# Patient Record
Sex: Male | Born: 1950
Health system: Southern US, Community
[De-identification: ages and names within clinical notes are randomized; demographics above are authoritative.]

## PROBLEM LIST (undated history)

## (undated) DIAGNOSIS — H409 Unspecified glaucoma: Secondary | ICD-10-CM

## (undated) DIAGNOSIS — D61818 Other pancytopenia: Secondary | ICD-10-CM

## (undated) DIAGNOSIS — H532 Diplopia: Secondary | ICD-10-CM

## (undated) HISTORY — PX: CATARACT EXTRACTION: SUR2

## (undated) HISTORY — DX: Diplopia: H53.2

## (undated) HISTORY — DX: Other pancytopenia: D61.818

## (undated) HISTORY — DX: Unspecified glaucoma: H40.9

## (undated) HISTORY — PX: HEMORROIDECTOMY: SUR656

---

## 2016-02-26 LAB — LIPID PANEL
Cholesterol: 161 mg/dL (ref 0–200)
HDL: 99 mg/dL — AB (ref 35–70)
LDL Cholesterol: 50 mg/dL
Triglycerides: 61 mg/dL (ref 40–160)

## 2016-02-26 LAB — BASIC METABOLIC PANEL
Creatinine: 1.1 mg/dL (ref <=1.3)
POTASSIUM: 4.4 mmol/L (ref 3.4–5.3)
SODIUM: 140 mmol/L (ref 137–147)

## 2016-02-26 LAB — CBC AND DIFFERENTIAL
HEMATOCRIT: 38 % — AB (ref 41–53)
HEMOGLOBIN: 12.8 g/dL — AB (ref 13.5–17.5)
WBC: 5.6 10*3/mL

## 2016-11-09 ENCOUNTER — Ambulatory Visit (INDEPENDENT_AMBULATORY_CARE_PROVIDER_SITE_OTHER): Payer: Medicare HMO | Admitting: Family

## 2016-11-09 ENCOUNTER — Encounter: Payer: Self-pay | Admitting: Family

## 2016-11-09 VITALS — BP 114/78 | HR 62 | Temp 98.1°F | Resp 14 | Ht 67.5 in | Wt 167.0 lb

## 2016-11-09 DIAGNOSIS — J069 Acute upper respiratory infection, unspecified: Secondary | ICD-10-CM

## 2016-11-09 DIAGNOSIS — H409 Unspecified glaucoma: Secondary | ICD-10-CM | POA: Diagnosis not present

## 2016-11-09 NOTE — Assessment & Plan Note (Signed)
Symptoms and exam consistent with acute upper respiratory infection most likely viral that is resolving. Continue conservative treatment with over-the-counter medications as needed for symptom relief and supportive care. Follow-up if symptoms worsen or do not improve.

## 2016-11-09 NOTE — Progress Notes (Signed)
Subjective:    Patient ID: Tanner Thompson, male    DOB: 20-Feb-1951, 66 y.o.   MRN: [id number]  Chief Complaint  Patient presents with  . Establish Care    cough x2 weeks     HPI:  Tanner Thompson is a 66 y.o. male who  has a past medical history of Glaucoma. and presents today for an office visit to establish care.  1.) Cough -  This is a new problem. Associated symptom of a cough that has been going on for about 2 weeks. No fevers. Modifying factors include cough syrup which seems. Overall course of the symptoms appears to be improving. No recent antibiotics. His wife was diagnosed with the flu a couple of weeks ago.  2.) Glaucoma - Currently maintained on Combigan. Reports taking the medication as prescribed and denies adverse side effects. He is new to the area and is looking to establish care with an ophthalmologist.    No Known Allergies    No outpatient prescriptions prior to visit.   No facility-administered medications prior to visit.      Past Medical History:  Diagnosis Date  . Glaucoma       History reviewed. No pertinent surgical history.    Family History  Problem Relation Age of Onset  . Breast cancer Mother   . Prostate cancer Father       Social History   Social History  . Marital status: Married    Spouse name: N/A  . Number of children: 2  . Years of education: 12   Occupational History  . Retired - Midwife     Social History Main Topics  . Smoking status: Never Smoker  . Smokeless tobacco: Never Used  . Alcohol use Yes     Comment: Occasional  . Drug use: No  . Sexual activity: Not on file   Other Topics Concern  . Not on file   Social History Narrative   Fun: Reading, art, physical activity      Review of Systems  Constitutional: Negative for chills and fever.  HENT: Negative for congestion, ear pain, sinus pain and sinus pressure.   Respiratory: Positive for cough. Negative for chest tightness and shortness of  breath.   Neurological: Negative for headaches.       Objective:    BP 114/78 (BP Location: Left Arm, Patient Position: Sitting, Cuff Size: Normal)   Pulse 62   Temp 98.1 F (36.7 C) (Oral)   Resp 14   Ht 5' 7.5" (1.715 m)   Wt 167 lb (75.8 kg)   SpO2 98%   BMI 25.77 kg/m  Nursing note and vital signs reviewed.  Physical Exam  Constitutional: He is oriented to person, place, and time. He appears well-developed and well-nourished. No distress.  HENT:  Right Ear: Hearing, tympanic membrane, external ear and ear canal normal.  Left Ear: Hearing, tympanic membrane, external ear and ear canal normal.  Nose: Nose normal. Right sinus exhibits no maxillary sinus tenderness and no frontal sinus tenderness. Left sinus exhibits no maxillary sinus tenderness and no frontal sinus tenderness.  Mouth/Throat: Uvula is midline, oropharynx is clear and moist and mucous membranes are normal.  Cardiovascular: Normal rate, regular rhythm, normal heart sounds and intact distal pulses.   Pulmonary/Chest: Effort normal and breath sounds normal.  Neurological: He is alert and oriented to person, place, and time.  Skin: Skin is warm and dry.  Psychiatric: He has a normal mood and affect. His behavior is  normal. Judgment and thought content normal.        Assessment & Plan:   Problem List Items Addressed This Visit      Respiratory   Acute upper respiratory infection - Primary    Symptoms and exam consistent with acute upper respiratory infection most likely viral that is resolving. Continue conservative treatment with over-the-counter medications as needed for symptom relief and supportive care. Follow-up if symptoms worsen or do not improve.        Other   Glaucoma    Glaucoma appears stable with current regimen and no adverse side effects or symptoms of increased pressure. Continue current dosage of Combigan and refer to opthalmology to establish and for follow up.      Relevant Medications    brimonidine-timolol (COMBIGAN) 0.2-0.5 % ophthalmic solution   Other Relevant Orders   Ambulatory referral to Ophthalmology       I am having Mr. Christell ConstantMoore maintain his brimonidine-timolol.   Meds ordered this encounter  Medications  . brimonidine-timolol (COMBIGAN) 0.2-0.5 % ophthalmic solution    Sig: Place 1 drop into both eyes every 12 (twelve) hours.     Follow-up: Return if symptoms worsen or fail to improve.  Jeanine Luzalone, Gregory, FNP

## 2016-11-09 NOTE — Patient Instructions (Addendum)
Thank you for choosing [facility].  SUMMARY AND INSTRUCTIONS:  Medication:  Please continue to take your medication as prescribed.   Referrals:  You will receive a call from our office for an appointment with an ophthalmologist.   Referrals have been made during this visit. You should expect to hear back from our schedulers in about 7-10 days in regards to establishing an appointment with the specialists we discussed.   Follow up:  If your symptoms worsen or fail to improve, please contact our office for further instruction, or in case of emergency go directly to the emergency room at the closest medical facility.   General Recommendations:    Please drink plenty of fluids.  Get plenty of rest   Sleep in humidified air  Use saline nasal sprays  Netti pot   OTC Medications:  Decongestants - helps relieve congestion   Flonase (generic fluticasone) or Nasacort (generic triamcinolone) - please make sure to use the "cross-over" technique at a 45 degree angle towards the opposite eye as opposed to straight up the nasal passageway.   Sudafed (generic pseudoephedrine - Note this is the one that is available behind the pharmacy counter); Products with phenylephrine (-PE) may also be used but is often not as effective as pseudoephedrine.   If you have HIGH BLOOD PRESSURE - Coricidin HBP; AVOID any product that is -D as this contains pseudoephedrine which may increase your blood pressure.  Afrin (oxymetazoline) every 6-8 hours for up to 3 days.   Allergies - helps relieve runny nose, itchy eyes and sneezing   Claritin (generic loratidine), Allegra (fexofenidine), or Zyrtec (generic cyrterizine) for runny nose. These medications should not cause drowsiness.  Note - Benadryl (generic diphenhydramine) may be used however may cause drowsiness  Cough -   Delsym or Robitussin (generic dextromethorphan)  Expectorants - helps loosen mucus to ease removal   Mucinex  (generic guaifenesin) as directed on the package.  Headaches / General Aches   Tylenol (generic acetaminophen) - DO NOT EXCEED 3 grams (3,000 mg) in a 24 hour time period  Advil/Motrin (generic ibuprofen)   Sore Throat -   Salt water gargle   Chloraseptic (generic benzocaine) spray or lozenges / Sucrets (generic dyclonine)      Upper Respiratory Infection, Adult Most upper respiratory infections (URIs) are caused by a virus. A URI affects the nose, throat, and upper air passages. The most common type of URI is often called "the common cold." Follow these instructions at home:  Take medicines only as told by your doctor.  Gargle warm saltwater or take cough drops to comfort your throat as told by your doctor.  Use a warm mist humidifier or inhale steam from a shower to increase air moisture. This may make it easier to breathe.  Drink enough fluid to keep your pee (urine) clear or pale yellow.  Eat soups and other clear broths.  Have a healthy diet.  Rest as needed.  Go back to work when your fever is gone or your doctor says it is okay.  You may need to stay home longer to avoid giving your URI to others.  You can also wear a face mask and wash your hands often to prevent spread of the virus.  Use your inhaler more if you have asthma.  Do not use any tobacco products, including cigarettes, chewing tobacco, or electronic cigarettes. If you need help quitting, ask your doctor. Contact a doctor if:  You are getting worse, not better.  Your  symptoms are not helped by medicine.  You have chills.  You are getting more short of breath.  You have brown or red mucus.  You have yellow or brown discharge from your nose.  You have pain in your face, especially when you bend forward.  You have a fever.  You have puffy (swollen) neck glands.  You have pain while swallowing.  You have white areas in the back of your throat. Get help right away if:  You have very  bad or constant:  Headache.  Ear pain.  Pain in your forehead, behind your eyes, and over your cheekbones (sinus pain).  Chest pain.  You have long-lasting (chronic) lung disease and any of the following:  Wheezing.  Long-lasting cough.  Coughing up blood.  A change in your usual mucus.  You have a stiff neck.  You have changes in your:  Vision.  Hearing.  Thinking.  Mood. This information is not intended to replace advice given to you by your health care provider. Make sure you discuss any questions you have with your health care provider. Document Released: 03/14/2008 Document Revised: 05/29/2016 Document Reviewed: 01/01/2014 Elsevier Interactive Patient Education  2017 [employer]

## 2016-11-09 NOTE — Assessment & Plan Note (Signed)
Glaucoma appears stable with current regimen and no adverse side effects or symptoms of increased pressure. Continue current dosage of Combigan and refer to opthalmology to establish and for follow up.

## 2016-12-19 DIAGNOSIS — H538 Other visual disturbances: Secondary | ICD-10-CM | POA: Diagnosis not present

## 2016-12-19 DIAGNOSIS — H532 Diplopia: Secondary | ICD-10-CM | POA: Diagnosis not present

## 2016-12-23 ENCOUNTER — Encounter (HOSPITAL_COMMUNITY): Payer: Self-pay | Admitting: *Deleted

## 2016-12-23 ENCOUNTER — Emergency Department (HOSPITAL_COMMUNITY): Payer: Medicare HMO

## 2016-12-23 ENCOUNTER — Emergency Department (HOSPITAL_COMMUNITY)
Admission: EM | Admit: 2016-12-23 | Discharge: 2016-12-24 | Disposition: A | Discharge status: Home / Self Care | Payer: Medicare HMO | Attending: Emergency Medicine | Admitting: Emergency Medicine

## 2016-12-23 DIAGNOSIS — D500 Iron deficiency anemia secondary to blood loss (chronic): Secondary | ICD-10-CM | POA: Diagnosis not present

## 2016-12-23 DIAGNOSIS — G939 Disorder of brain, unspecified: Secondary | ICD-10-CM | POA: Insufficient documentation

## 2016-12-23 DIAGNOSIS — H532 Diplopia: Secondary | ICD-10-CM | POA: Diagnosis not present

## 2016-12-23 DIAGNOSIS — D649 Anemia, unspecified: Secondary | ICD-10-CM | POA: Insufficient documentation

## 2016-12-23 DIAGNOSIS — D696 Thrombocytopenia, unspecified: Secondary | ICD-10-CM | POA: Diagnosis not present

## 2016-12-23 DIAGNOSIS — D72819 Decreased white blood cell count, unspecified: Secondary | ICD-10-CM | POA: Diagnosis not present

## 2016-12-23 DIAGNOSIS — H401130 Primary open-angle glaucoma, bilateral, stage unspecified: Secondary | ICD-10-CM | POA: Diagnosis not present

## 2016-12-23 LAB — CBC WITH DIFFERENTIAL/PLATELET
Basophils Absolute: 0 10*3/uL (ref 0.0–0.1)
Basophils Relative: 0 %
Eosinophils Absolute: 0.1 10*3/uL (ref 0.0–0.7)
Eosinophils Relative: 3 %
HCT: 38.7 % — ABNORMAL LOW (ref 39.0–52.0)
HEMOGLOBIN: 12.6 g/dL — AB (ref 13.0–17.0)
LYMPHS ABS: 1.6 10*3/uL (ref 0.7–4.0)
LYMPHS PCT: 46 %
MCH: 26.2 pg (ref 26.0–34.0)
MCHC: 32.6 g/dL (ref 30.0–36.0)
MCV: 80.5 fL (ref 78.0–100.0)
MONOS PCT: 12 %
Monocytes Absolute: 0.5 10*3/uL (ref 0.1–1.0)
NEUTROS ABS: 1.4 10*3/uL — AB (ref 1.7–7.7)
NEUTROS PCT: 39 %
Platelets: 143 10*3/uL — ABNORMAL LOW (ref 150–400)
RBC: 4.81 MIL/uL (ref 4.22–5.81)
RDW: 13.8 % (ref 11.5–15.5)
WBC: 3.6 10*3/uL — ABNORMAL LOW (ref 4.0–10.5)

## 2016-12-23 LAB — COMPREHENSIVE METABOLIC PANEL
ALBUMIN: 4.1 g/dL (ref 3.5–5.0)
ALK PHOS: 59 U/L (ref 38–126)
ALT: 36 U/L (ref 17–63)
ANION GAP: 8 (ref 5–15)
AST: 26 U/L (ref 15–41)
BILIRUBIN TOTAL: 1 mg/dL (ref 0.3–1.2)
BUN: 15 mg/dL (ref 6–20)
CALCIUM: 9.2 mg/dL (ref 8.9–10.3)
CO2: 25 mmol/L (ref 22–32)
Chloride: 106 mmol/L (ref 101–111)
Creatinine, Ser: 1.02 mg/dL (ref 0.61–1.24)
GFR calc non Af Amer: >60 mL/min (ref >60)
GLUCOSE: 89 mg/dL (ref 65–99)
Potassium: 3.9 mmol/L (ref 3.5–5.1)
Sodium: 139 mmol/L (ref 135–145)
TOTAL PROTEIN: 7 g/dL (ref 6.5–8.1)

## 2016-12-23 MED ORDER — GADOBENATE DIMEGLUMINE 529 MG/ML IV SOLN
15.0000 mL | Freq: Once | INTRAVENOUS | Status: AC | PRN
  Administered 2016-12-23: 15 mL via INTRAVENOUS

## 2016-12-23 NOTE — ED Provider Notes (Signed)
[facility]-EMERGENCY DEPT Provider Note   CSN: [id number] Arrival date & time: 12/23/16  1750 By signing my name below, I, Levon Hedger, attest that this documentation has been prepared under the direction and in the presence of Dione Booze, MD . Electronically Signed: Levon Hedger, Scribe. 12/23/2016. 11:36 PM.   History   Chief Complaint Chief Complaint  Patient presents with  . Diplopia   HPI Tanner Thompson is a 66 y.o. male with a history of glaucoma who presents to the Emergency Department complaining of intermittent, gradually improving diplopia onset one week ago. Per pt, his double vision is exacerbated by looking to his left side and alleviated by looking straight ahead. He notes associated dizziness secondary to diplopia that is exacerbated by turning his head. Pt was evaluated by his opthalmologist today and was referred to the ED for a MRI. He denies any difficulty seeing, weakness, numbness, tingling, aphasia, or any other associated symptoms.   The history is provided by the patient. No language interpreter was used.   Past Medical History:  Diagnosis Date  . Glaucoma     Patient Active Problem List   Diagnosis Date Noted  . Glaucoma 11/09/2016  . Acute upper respiratory infection 11/09/2016    History reviewed. No pertinent surgical history.    Home Medications    Prior to Admission medications   Medication Sig Start Date End Date Taking? Authorizing Provider  brimonidine-timolol (COMBIGAN) 0.2-0.5 % ophthalmic solution Place 1 drop into both eyes every 12 (twelve) hours.    Historical Provider, MD    Family History Family History  Problem Relation Age of Onset  . Breast cancer Mother   . Prostate cancer Father     Social History Social History  Substance Use Topics  . Smoking status: Never Smoker  . Smokeless tobacco: Never Used  . Alcohol use Yes     Comment: Occasional     Allergies   Patient has no known allergies.   Review of  Systems Review of Systems  Eyes: Positive for visual disturbance.  Neurological: Positive for dizziness. Negative for speech difficulty, weakness and numbness.  All other systems reviewed and are negative.  Physical Exam Updated Vital Signs BP 122/81 (BP Location: Left Arm)   Pulse (!) 58   Temp 97.9 F (36.6 C) (Oral)   Resp 17   Ht 5' 6.5" (1.689 m)   Wt 165 lb (74.8 kg)   SpO2 100%   BMI 26.23 kg/m   Physical Exam  Constitutional: He is oriented to person, place, and time. He appears well-developed and well-nourished.  HENT:  Head: Normocephalic and atraumatic.  Eyes: Pupils are equal, round, and reactive to light.  Neck: Normal range of motion. Neck supple. No JVD present.  Cardiovascular: Normal rate, regular rhythm and normal heart sounds.   No murmur heard. Pulmonary/Chest: Effort normal and breath sounds normal. He has no wheezes. He has no rales. He exhibits no tenderness.  Abdominal: Soft. Bowel sounds are normal. He exhibits no distension and no mass. There is no tenderness.  Musculoskeletal: Normal range of motion. He exhibits no edema.  Lymphadenopathy:    He has no cervical adenopathy.  Neurological: He is alert and oriented to person, place, and time. No cranial nerve deficit. He exhibits normal muscle tone. Coordination normal.  Decreased lateral movement of both eyes, worse looking to the left. No pronator drift.  Skin: Skin is warm and dry. No rash noted.  Psychiatric: He has a normal mood and affect. His  behavior is normal. Judgment and thought content normal.  Nursing note and vitals reviewed.  ED Treatments / Results  DIAGNOSTIC STUDIES:  Oxygen Saturation is 100% on RA, normal by my interpretation.    COORDINATION OF CARE:  11:34 PM Discussed treatment plan with pt at bedside and pt agreed to plan.   Labs (all labs ordered are listed, but only abnormal results are displayed) Labs Reviewed  CBC WITH DIFFERENTIAL/PLATELET - Abnormal; Notable for  the following:       Result Value   WBC 3.6 (*)    Hemoglobin 12.6 (*)    HCT 38.7 (*)    Platelets 143 (*)    Neutro Abs 1.4 (*)    All other components within normal limits  COMPREHENSIVE METABOLIC PANEL  THYROTROPIN RECEPTOR AUTOABS  ACETYLCHOLINE RECEPTOR AB, ALL    EKG  EKG Interpretation  Date/Time:  Friday December 23 2016 18:35:00 EDT Ventricular Rate:  55 PR Interval:  186 QRS Duration: 86 QT Interval:  408 QTC Calculation: 390 R Axis:   85 Text Interpretation:  Sinus bradycardia Otherwise normal ECG No old tracing to compare Confirmed by KNAPP  MD-J, JON [id number]) on 12/23/2016 11:25:00 PM       Radiology Mr Brain W And Wo Contrast  Result Date: 12/23/2016 CLINICAL DATA:  Double vision and dizziness that worsens head movements EXAM: MRI HEAD AND ORBITS WITHOUT AND WITH CONTRAST TECHNIQUE: Multiplanar, multiecho pulse sequences of the brain and surrounding structures were obtained without and with intravenous contrast. Multiplanar, multiecho pulse sequences of the orbits and surrounding structures were obtained including fat saturation techniques, before and after intravenous contrast administration. CONTRAST:  15mL MULTIHANCE GADOBENATE DIMEGLUMINE 529 MG/ML IV SOLN COMPARISON:  None. FINDINGS: MRI HEAD FINDINGS Brain: No focal diffusion restriction to indicate acute infarct. No intraparenchymal hemorrhage. There is a focal area of hyperintense T2 weighted signal within the brainstem posteriorly, adjacent to the cerebral aqueduct. This is the area of the third cranial nerve nucleus. No associated contrast enhancement. No midline shift or other mass effect. No hydrocephalus or extra-axial fluid collection. No age advanced or lobar predominant atrophy. Vascular: Major intracranial arterial and venous sinus flow voids are preserved. No evidence of chronic microhemorrhage or amyloid angiopathy. Skull and upper cervical spine: The visualized skull base, calvarium, upper cervical spine  and extracranial soft tissues are normal. MRI ORBITS FINDINGS Orbits: Both optic nerves are normal in caliber and signal. The optic chiasm is normal. The extraocular muscles and lacrimal fossae are normal. Visualized sinuses: No fluid levels or advanced mucosal thickening. Soft tissues: Normal IMPRESSION: 1. Hyperintense T2 weighted signal lesion of the posterior brainstem, at the site of the oculomotor nerve nucleus, with no contrast enhancement or diffusion restriction. The etiology of this lesion is uncertain, but the primary differential considerations are neoplasm, such as a low grade glioma, or demyelinating disease. Infectious or vascular processes are considered less likely. 2. Normal appearance of the orbits and optic nerves. Electronically Signed   By: Deatra Robinson M.D.   On: 12/23/2016 23:47   Mr Rockwell [place] IR Contrast  Result Date: 12/23/2016 CLINICAL DATA:  Double vision and dizziness that worsens head movements EXAM: MRI HEAD AND ORBITS WITHOUT AND WITH CONTRAST TECHNIQUE: Multiplanar, multiecho pulse sequences of the brain and surrounding structures were obtained without and with intravenous contrast. Multiplanar, multiecho pulse sequences of the orbits and surrounding structures were obtained including fat saturation techniques, before and after intravenous contrast administration. CONTRAST:  15mL MULTIHANCE GADOBENATE DIMEGLUMINE 529 MG/ML IV  SOLN COMPARISON:  None. FINDINGS: MRI HEAD FINDINGS Brain: No focal diffusion restriction to indicate acute infarct. No intraparenchymal hemorrhage. There is a focal area of hyperintense T2 weighted signal within the brainstem posteriorly, adjacent to the cerebral aqueduct. This is the area of the third cranial nerve nucleus. No associated contrast enhancement. No midline shift or other mass effect. No hydrocephalus or extra-axial fluid collection. No age advanced or lobar predominant atrophy. Vascular: Major intracranial arterial and venous sinus flow voids  are preserved. No evidence of chronic microhemorrhage or amyloid angiopathy. Skull and upper cervical spine: The visualized skull base, calvarium, upper cervical spine and extracranial soft tissues are normal. MRI ORBITS FINDINGS Orbits: Both optic nerves are normal in caliber and signal. The optic chiasm is normal. The extraocular muscles and lacrimal fossae are normal. Visualized sinuses: No fluid levels or advanced mucosal thickening. Soft tissues: Normal IMPRESSION: 1. Hyperintense T2 weighted signal lesion of the posterior brainstem, at the site of the oculomotor nerve nucleus, with no contrast enhancement or diffusion restriction. The etiology of this lesion is uncertain, but the primary differential considerations are neoplasm, such as a low grade glioma, or demyelinating disease. Infectious or vascular processes are considered less likely. 2. Normal appearance of the orbits and optic nerves. Electronically Signed   By: Deatra RobinsonKevin  Herman M.D.   On: 12/23/2016 23:47    Procedures Procedures (including critical care time)  Medications Ordered in ED Medications  gadobenate dimeglumine (MULTIHANCE) injection 15 mL (15 mLs Intravenous Contrast Given 12/23/16 2300)     Initial Impression / Assessment and Plan / ED Course  I have reviewed the triage vital signs and the nursing notes.  Pertinent labs & imaging results that were available during my care of the patient were reviewed by me and considered in my medical decision making (see chart for details).  Diplopia of uncertain cause. No other neurologic findings. MRI has been ordered and does show a lesion in the brainstem of uncertain cause. I discussed the case with Dr. Amada JupiterKirkpatrick of neurology service who has reviewed the MRI scan. Given stability of symptoms over one week, he does not feel that the patient needs to be admitted. He does need outpatient referral to neurology and this is arranged. He is to continue wearing an eye patch to prevent  diplopia but advised to alternate which eye is patched to maintain normal vision in each eye. Mild anemia is noted of uncertain significance. Also mild leukopenia and thrombocytopenia. This may indicate a viral illness, but is probably not related to his diplopia. Old records are reviewed, and he has no relevant past visits.  Final Clinical Impressions(s) / ED Diagnoses   Final diagnoses:  Diplopia  Brainstem lesion  Normochromic normocytic anemia    New Prescriptions New Prescriptions   No medications on file   I personally performed the services described in this documentation, which was scribed in my presence. The recorded information has been reviewed and is accurate.       Dione Boozeavid Teralyn Mullins, MD 12/24/16 [phone]

## 2016-12-23 NOTE — ED Triage Notes (Signed)
Pt seen by ophthalmology today sent here for MRI per notes provided by pt, pt has L eye patch, pt reports dizzinesse when he moves his head & double vision, pt A&O x4, no slurred speech, A&O x4

## 2016-12-23 NOTE — ED Notes (Signed)
Pt reports he is having double vision and has been having same for a week now. Pt reports decreased double vision since this all initially started.

## 2016-12-23 NOTE — ED Notes (Signed)
Pt approached nurse first regarding wait time for MRI; MRI states that he is the next patient back, est 10 mins

## 2016-12-23 NOTE — ED Notes (Signed)
Mesner, MD aware of pts presentation & request from ophthalmology office, verbal order to place orders

## 2016-12-24 LAB — THYROTROPIN RECEPTOR AUTOABS: Thyrotropin Receptor Ab: 0.9 IU/L (ref 0.00–1.75)

## 2016-12-24 NOTE — Discharge Instructions (Signed)
Your MRI scan did show something in the back part of the brain. Please follow up with the neurologist for further evaluation. You may need to have the MRI repeated in 2-4 weeks to see if this lesion is changing. In the meantime, please continue to wear a patch over one eye to prevent double vision. Please vary which eye is patched so that you continue to use each eye for vision.

## 2016-12-28 ENCOUNTER — Encounter: Payer: Self-pay | Admitting: Neurology

## 2016-12-28 ENCOUNTER — Ambulatory Visit (INDEPENDENT_AMBULATORY_CARE_PROVIDER_SITE_OTHER): Payer: Medicare HMO | Admitting: Neurology

## 2016-12-28 VITALS — BP 103/63 | HR 65 | Ht 66.5 in | Wt 168.0 lb

## 2016-12-28 DIAGNOSIS — R9089 Other abnormal findings on diagnostic imaging of central nervous system: Secondary | ICD-10-CM | POA: Diagnosis not present

## 2016-12-28 DIAGNOSIS — H532 Diplopia: Secondary | ICD-10-CM | POA: Diagnosis not present

## 2016-12-28 DIAGNOSIS — D61818 Other pancytopenia: Secondary | ICD-10-CM | POA: Diagnosis not present

## 2016-12-28 HISTORY — DX: Other pancytopenia: D61.818

## 2016-12-28 NOTE — Patient Instructions (Signed)
   We will check blood work today, and possibly get a spinal tap in the future.

## 2016-12-28 NOTE — Progress Notes (Signed)
Reason for visit: Double vision  Referring physician: [place]  Tanner Thompson is a 66 y.o. male  History of present illness:  Tanner Thompson is a 66 year old right-handed black male with a history of onset of double vision around the ninth or 10th of March 2018. The patient went to his eye doctor and was from there sent to the emergency room. The patient was noted to have incomplete temporal deviation of the right eye resulting in double vision when he looked to the right. The patient denied any headache or eye pain with onset, but he noted the problem came on relatively suddenly. Since that time, his double vision has improved some. He does report some dizziness sensation and mild gait instability with the double vision. He went to the emergency room and underwent MRI of the brain showing evidence of a dorsal pontine/midbrain lesion that was not fully consistent with stroke. The patient has been sent to this office for an evaluation. Blood work in the emergency room shows evidence of a mild pancytopenia. The patient has known about a mild anemia for at least a year or more. The patient was treated with vitamin D supplementation and iron. The patient denies any other symptoms such as slurred speech, headache, neck stiffness, swallowing problems, or numbness or weakness of the face, arms, or legs. He denies any memory problems or confusion. The patient is coming here for further evaluation of the double vision. The patient has noted a decline in taste perception, this has happened since the double vision, he denies any night sweats or weight loss.  Past Medical History:  Diagnosis Date  . Glaucoma     Past Surgical History:  Procedure Laterality Date  . CATARACT EXTRACTION Bilateral    One eye laser removal, other removed about 15 yr ago  . HEMORROIDECTOMY      Family History  Problem Relation Age of Onset  . Breast cancer Mother   . Prostate cancer Father     Social history:  reports  that he has never smoked. He has never used smokeless tobacco. He reports that he drinks alcohol. He reports that he does not use drugs.  Medications:  Prior to Admission medications   Medication Sig Start Date End Date Taking? Authorizing Provider  brimonidine-timolol (COMBIGAN) 0.2-0.5 % ophthalmic solution Place 1 drop into both eyes every 12 (twelve) hours.   Yes Historical Provider, MD     No Known Allergies  ROS:  Out of a complete 14 system review of symptoms, the patient complains only of the following symptoms, and all other reviewed systems are negative.  Fatigue Dizziness Blurred vision, double vision Allergies Numbness, dizziness Too much sleep, decreased energy, change in appetite  Blood pressure 103/63, pulse 65, height 5' 6.5" (1.689 m), weight 168 lb (76.2 kg).  Physical Exam  General: The patient is alert and cooperative at the time of the examination.  Eyes: Pupils are equal, round, and reactive to light. Discs are flat bilaterally.  Neck: The neck is supple, no carotid bruits are noted.  Respiratory: The respiratory examination is clear.  Cardiovascular: The cardiovascular examination reveals a regular rate and rhythm, no obvious murmurs or rubs are noted.  Skin: Extremities are without significant edema.  Neurologic Exam  Mental status: The patient is alert and oriented x 3 at the time of the examination. The patient has apparent normal recent and remote memory, with an apparently normal attention span and concentration ability.  Cranial nerves: Facial symmetry  is present. There is good sensation of the face to pinprick and soft touch bilaterally. The strength of the facial muscles and the muscles to head turning and shoulder shrug are normal bilaterally. Speech is well enunciated, no aphasia or dysarthria is noted. Extraocular movements are full, with exception that there is incomplete abduction of the right eye with looking to the right. Visual fields  are full. The tongue is midline, and the patient has symmetric elevation of the soft palate. No obvious hearing deficits are noted.  Motor: The motor testing reveals 5 over 5 strength of all 4 extremities. Good symmetric motor tone is noted throughout.  Sensory: Sensory testing is intact to pinprick, soft touch, vibration sensation, and position sense on all 4 extremities. No evidence of extinction is noted.  Coordination: Cerebellar testing reveals good finger-nose-finger and heel-to-shin bilaterally.  Gait and station: Gait is normal. Tandem gait is slightly unsteady. Romberg is negative. No drift is seen.  Reflexes: Deep tendon reflexes are symmetric and normal bilaterally. Toes are downgoing bilaterally.    MRI brain 12/23/16:  IMPRESSION: 1. Hyperintense T2 weighted signal lesion of the posterior brainstem, at the site of the oculomotor nerve nucleus, with no contrast enhancement or diffusion restriction. The etiology of this lesion is uncertain, but the primary differential considerations are neoplasm, such as a low grade glioma, or demyelinating disease. Infectious or vascular processes are considered less likely. 2. Normal appearance of the orbits and optic nerves.  * MRI scan images were reviewed online. I agree with the written report.    Assessment/Plan:  1. Double vision  2. Abnormal MRI brain  3. Pancytopenia  The patient has a midline brainstem lesion, etiology is unclear. The patient will need to be evaluated for possible demyelinating disease, Devic's disease, neurosarcoidosis, or vasculitis. The patient will undergo blood work today, we may consider a lumbar puncture in the near future. The patient will follow-up in about 4 months. We will repeat the MRI of the brain at some point in the future. The patient is to contact our office if any new symptoms arise. The patient does have a pancytopenia, he may require a hematologic evaluation in the future.  Tanner Palau. Keith  Kahli Mayon MD 12/28/2016 3:48 PM  [facility] [address] [place], [place] [id number]  Phone [phone] Fax [phone]

## 2016-12-30 LAB — ACETYLCHOLINE RECEPTOR AB, ALL: ACETYLCHOL BLOCK AB: 4 % (ref 0–25)

## 2016-12-31 LAB — ANGIOTENSIN CONVERTING ENZYME: ANGIO CONVERT ENZYME: 55 U/L (ref 14–82)

## 2016-12-31 LAB — MULTIPLE MYELOMA PANEL, SERUM
Albumin SerPl Elph-Mcnc: 4.2 g/dL (ref 2.9–4.4)
Albumin/Glob SerPl: 1.5 (ref 0.7–1.7)
Alpha 1: 0.2 g/dL (ref 0.0–0.4)
Alpha2 Glob SerPl Elph-Mcnc: 0.5 g/dL (ref 0.4–1.0)
B-GLOBULIN SERPL ELPH-MCNC: 0.9 g/dL (ref 0.7–1.3)
GAMMA GLOB SERPL ELPH-MCNC: 1.3 g/dL (ref 0.4–1.8)
GLOBULIN, TOTAL: 2.9 g/dL (ref 2.2–3.9)
IGA/IMMUNOGLOBULIN A, SERUM: 155 mg/dL (ref 61–437)
IgG (Immunoglobin G), Serum: 1227 mg/dL (ref 700–1600)
IgM (Immunoglobulin M), Srm: 42 mg/dL (ref 20–172)
Total Protein: 7.1 g/dL (ref 6.0–8.5)

## 2016-12-31 LAB — PAN-ANCA
ANCA Proteinase 3: <3.5 U/mL (ref 0.0–3.5)
Atypical pANCA: <1:20 {titer}
C-ANCA: <1:20 {titer}
Myeloperoxidase Ab: <9 U/mL (ref 0.0–9.0)

## 2016-12-31 LAB — HIV ANTIBODY (ROUTINE TESTING W REFLEX): HIV SCREEN 4TH GENERATION: NONREACTIVE

## 2016-12-31 LAB — VITAMIN B12: Vitamin B-12: 832 pg/mL (ref 232–1245)

## 2016-12-31 LAB — NEUROMYELITIS OPTICA AUTOAB, IGG: NMO IgG Autoantibodies: <1.5 U/mL (ref 0.0–3.0)

## 2016-12-31 LAB — SEDIMENTATION RATE: Sed Rate: 4 mm/hr (ref 0–30)

## 2016-12-31 LAB — RHEUMATOID FACTOR: Rhuematoid fact SerPl-aCnc: <10 IU/mL (ref 0.0–13.9)

## 2016-12-31 LAB — B. BURGDORFI ANTIBODIES: Lyme IgG/IgM Ab: <0.91 {ISR} (ref 0.00–0.90)

## 2016-12-31 LAB — ANA W/REFLEX: ANA: NEGATIVE

## 2017-01-03 ENCOUNTER — Telehealth: Payer: Self-pay | Admitting: Neurology

## 2017-01-03 NOTE — Telephone Encounter (Signed)
I called patient. The blood work was completely normal. Etiology of the brain lesion is not clear, we'll need to recheck a MRI the brain sometime in the next several months. I have recommended getting lumbar puncture, the patient wishes to think about this procedure, he will call me later if he decides that he wants the procedure done.

## 2017-01-04 ENCOUNTER — Encounter: Payer: Self-pay | Admitting: Family

## 2017-01-27 DIAGNOSIS — H401130 Primary open-angle glaucoma, bilateral, stage unspecified: Secondary | ICD-10-CM | POA: Diagnosis not present

## 2017-01-27 DIAGNOSIS — H532 Diplopia: Secondary | ICD-10-CM | POA: Diagnosis not present

## 2017-02-20 ENCOUNTER — Encounter: Payer: Self-pay | Admitting: Family

## 2017-02-20 ENCOUNTER — Ambulatory Visit (INDEPENDENT_AMBULATORY_CARE_PROVIDER_SITE_OTHER): Payer: Medicare HMO | Admitting: Family

## 2017-02-20 ENCOUNTER — Other Ambulatory Visit (INDEPENDENT_AMBULATORY_CARE_PROVIDER_SITE_OTHER): Payer: Medicare HMO

## 2017-02-20 VITALS — BP 108/66 | HR 59 | Temp 98.3°F | Resp 16 | Ht 66.5 in | Wt 166.0 lb

## 2017-02-20 DIAGNOSIS — Z1211 Encounter for screening for malignant neoplasm of colon: Secondary | ICD-10-CM

## 2017-02-20 DIAGNOSIS — Z23 Encounter for immunization: Secondary | ICD-10-CM

## 2017-02-20 DIAGNOSIS — Z125 Encounter for screening for malignant neoplasm of prostate: Secondary | ICD-10-CM

## 2017-02-20 DIAGNOSIS — Z0000 Encounter for general adult medical examination without abnormal findings: Secondary | ICD-10-CM

## 2017-02-20 LAB — LIPID PANEL
CHOL/HDL RATIO: 2
Cholesterol: 180 mg/dL (ref 0–200)
HDL: 87 mg/dL (ref >39.00)
LDL Cholesterol: 80 mg/dL (ref 0–99)
NONHDL: 93.03
Triglycerides: 63 mg/dL (ref 0.0–149.0)
VLDL: 12.6 mg/dL (ref 0.0–40.0)

## 2017-02-20 LAB — CBC
HCT: 40.6 % (ref 39.0–52.0)
HEMOGLOBIN: 13.4 g/dL (ref 13.0–17.0)
MCHC: 32.9 g/dL (ref 30.0–36.0)
MCV: 80.1 fl (ref 78.0–100.0)
Platelets: 160 10*3/uL (ref 150.0–400.0)
RBC: 5.06 Mil/uL (ref 4.22–5.81)
RDW: 14.3 % (ref 11.5–15.5)
WBC: 3.2 10*3/uL — ABNORMAL LOW (ref 4.0–10.5)

## 2017-02-20 LAB — COMPREHENSIVE METABOLIC PANEL
ALT: 16 U/L (ref 0–53)
AST: 17 U/L (ref 0–37)
Albumin: 4.5 g/dL (ref 3.5–5.2)
Alkaline Phosphatase: 98 U/L (ref 39–117)
BILIRUBIN TOTAL: 0.8 mg/dL (ref 0.2–1.2)
BUN: 15 mg/dL (ref 6–23)
CO2: 29 meq/L (ref 19–32)
Calcium: 9.2 mg/dL (ref 8.4–10.5)
Chloride: 107 mEq/L (ref 96–112)
Creatinine, Ser: 1.13 mg/dL (ref 0.40–1.50)
GFR: 83.16 mL/min (ref >60.00)
GLUCOSE: 106 mg/dL — AB (ref 70–99)
Potassium: 4.6 mEq/L (ref 3.5–5.1)
Sodium: 140 mEq/L (ref 135–145)
Total Protein: 7.1 g/dL (ref 6.0–8.3)

## 2017-02-20 LAB — PSA: PSA: 0.44 ng/mL (ref 0.10–4.00)

## 2017-02-20 MED ORDER — ZOSTER VAC RECOMB ADJUVANTED 50 MCG/0.5ML IM SUSR: 0.5000 mL | Freq: Once | INTRAMUSCULAR | 0 refills | Status: DC

## 2017-02-20 MED ORDER — ZOSTER VAC RECOMB ADJUVANTED 50 MCG/0.5ML IM SUSR: 0.5000 mL | Freq: Once | INTRAMUSCULAR | 0 refills | Status: AC

## 2017-02-20 NOTE — Progress Notes (Signed)
Subjective:    Patient ID: Tanner Thompson, male    DOB: 12-02-1950, 66 y.o.   MRN: [id number]  Chief Complaint  Patient presents with  . AWV    fasting    HPI:  Tanner Thompson is a 66 y.o. male who presents today for a Medicare Annual Wellness/Physical exam.    1) Health Maintenance -   Diet - Averages about 2-3 meals per day consisting of a regular diet; Caffeine intake of 1-2 cups per day.  Exercise - Walks daily for about 30 minutes; considering joining the gym.   2) Preventative Exams / Immunizations:  Dental -- Up todate  Vision -- Up to date   Health Maintenance  Topic Date Due  . TETANUS/TDAP  10/20/1968  . COLONOSCOPY  10/21/1999  . PNA vac Low Risk Adult (1 of 2 - PCV13) 10/20/2014  . INFLUENZA VACCINE  05/10/2017  . Hepatitis C Screening  Completed     Immunization History  Administered Date(s) Administered  . Pneumococcal Conjugate-13 02/20/2017    RISK FACTORS  Tobacco History  Smoking Status  . Never Smoker  Smokeless Tobacco  . Never Used     Cardiac risk factors: advanced age (older than 44 for men, 11 for women) and male gender.  Depression Screen  Depression screen [facility] 2/9 02/20/2017  Decreased Interest 0  Down, Depressed, Hopeless 0  PHQ - 2 Score 0     Activities of Daily Living In your present state of health, do you have any difficulty performing the following activities?:  Driving? No Managing money?  No Feeding yourself? No Getting from bed to chair? No Climbing a flight of stairs? No Preparing food and eating?: No Bathing or showering? No Getting dressed: No Getting to the toilet? No Using the toilet: No Moving around from place to place: No In the past year have you fallen or had a near fall?:No   Home Safety Has smoke detector and wears seat belts. No firearms. No excess sun exposure. Are there smokers in your home (other than you)?  No Do you feel safe at home?  Yes  Hearing Difficulties: No Do you  often ask people to speak up or repeat themselves? No Do you experience ringing or noises in your ears? No  Do you have difficulty understanding soft or whispered voices? No    Cognitive Testing  Alert? Yes   Normal Appearance? Yes  Oriented to person? Yes  Place? Yes   Time? Yes  Recall of three objects?  Yes  Can perform simple calculations? Yes  Displays appropriate judgment? Yes  Can read the correct time from a watch face? Yes  Do you feel that you have a problem with memory? No  Do you often misplace items? No   Advanced Directives have been discussed with the patient? Yes   Current Physicians/Providers and Suppliers  1. Marcos Eke, FNP - Internal Medicine 2. Stephanie Acre, MD - Neurology  Indicate any recent Medical Services you may have received from other than [facility] providers in the past year (date may be approximate).  All answers were reviewed with the patient and necessary referrals were made:  Jeanine Luz, FNP   02/20/2017    No Known Allergies   Outpatient Medications Prior to Visit  Medication Sig Dispense Refill  . brimonidine-timolol (COMBIGAN) 0.2-0.5 % ophthalmic solution Place 1 drop into both eyes every 12 (twelve) hours.     No facility-administered medications prior to visit.  Past Medical History:  Diagnosis Date  . Diplopia   . Glaucoma   . Pancytopenia (HCC) 12/28/2016     Past Surgical History:  Procedure Laterality Date  . CATARACT EXTRACTION Bilateral    One eye laser removal, other removed about 15 yr ago  . HEMORROIDECTOMY       Family History  Problem Relation Age of Onset  . Breast cancer Mother   . Prostate cancer Father      Social History   Social History  . Marital status: Married    Spouse name: Steward Drone  . Number of children: 2  . Years of education: 12   Occupational History  . Retired - Midwife     Social History Main Topics  . Smoking status: Never Smoker  . Smokeless tobacco: Never Used  .  Alcohol use Yes     Comment: Occasional  . Drug use: No  . Sexual activity: Not on file   Other Topics Concern  . Not on file   Social History Narrative   Lives with spouse   Fun: Reading, art, physical activity     Review of Systems  Constitutional: Denies fever, chills, fatigue, or significant weight gain/loss. HENT: Head: Denies headache or neck pain Ears: Denies changes in hearing, ringing in ears, earache, drainage Nose: Denies discharge, stuffiness, itching, nosebleed, sinus pain Throat: Denies sore throat, hoarseness, dry mouth, sores, thrush Eyes: Denies loss/changes in vision, pain, redness, blurry/double vision, flashing lights Cardiovascular: Denies chest pain/discomfort, tightness, palpitations, shortness of breath with activity, difficulty lying down, swelling, sudden awakening with shortness of breath Respiratory: Denies shortness of breath, cough, sputum production, wheezing Gastrointestinal: Denies dysphasia, heartburn, change in appetite, nausea, change in bowel habits, rectal bleeding, constipation, diarrhea, yellow skin or eyes Genitourinary: Denies frequency, urgency, burning/pain, blood in urine, incontinence, change in urinary strength. Musculoskeletal: Denies muscle/joint pain, stiffness, back pain, redness or swelling of joints, trauma Skin: Denies rashes, lumps, itching, dryness, color changes, or hair/nail changes Neurological: Denies dizziness, fainting, seizures, weakness, numbness, tingling, tremor Psychiatric - Denies nervousness, stress, depression or memory loss Endocrine: Denies heat or cold intolerance, sweating, frequent urination, excessive thirst, changes in appetite Hematologic: Denies ease of bruising or bleeding    Objective:     BP 108/66 (BP Location: Left Arm, Patient Position: Sitting, Cuff Size: Normal)   Pulse (!) 59   Temp 98.3 F (36.8 C) (Oral)   Resp 16   Ht 5' 6.5" (1.689 m)   Wt 166 lb (75.3 kg)   SpO2 97%   BMI 26.39  kg/m  Nursing note and vital signs reviewed.   Visual Acuity Screening   Right eye Left eye Both eyes  Without correction:     With correction: 20/13 20/13 20/13     Physical Exam  Constitutional: He is oriented to person, place, and time. He appears well-developed and well-nourished.  HENT:  Head: Normocephalic.  Right Ear: Hearing, tympanic membrane, external ear and ear canal normal.  Left Ear: Hearing, tympanic membrane, external ear and ear canal normal.  Nose: Nose normal.  Mouth/Throat: Uvula is midline, oropharynx is clear and moist and mucous membranes are normal.  Eyes: Conjunctivae and EOM are normal. Pupils are equal, round, and reactive to light.  Neck: Neck supple. No JVD present. No tracheal deviation present. No thyromegaly present.  Cardiovascular: Normal rate, regular rhythm, normal heart sounds and intact distal pulses.   Pulmonary/Chest: Effort normal and breath sounds normal.  Abdominal: Soft. Bowel sounds are normal. He exhibits  no distension and no mass. There is no tenderness. There is no rebound and no guarding.  Musculoskeletal: Normal range of motion. He exhibits no edema or tenderness.  Lymphadenopathy:    He has no cervical adenopathy.  Neurological: He is alert and oriented to person, place, and time. He has normal reflexes. No cranial nerve deficit. He exhibits normal muscle tone. Coordination normal.  Skin: Skin is warm and dry.  Psychiatric: He has a normal mood and affect. His behavior is normal. Judgment and thought content normal.       Assessment & Plan:   During the course of the visit the patient was educated and counseled about appropriate screening and preventive services including:    Pneumococcal vaccine   Td vaccine  Prostate cancer screening  Colorectal cancer screening  Glaucoma screening  Nutrition counseling   Diet review for nutrition referral? Yes ____  Not Indicated _X___   Patient Instructions (the written plan)  was given to the patient.  Medicare Attestation I have personally reviewed: The patient's medical and social history Their use of alcohol, tobacco or illicit drugs Their current medications and supplements The patient's functional ability including ADLs,fall risks, home safety risks, cognitive, and hearing and visual impairment Diet and physical activities Evidence for depression or mood disorders  The patient's weight, height, BMI,  have been recorded in the chart.  I have made referrals, counseling, and provided education to the patient based on review of the above and I have provided the patient with a written personalized care plan for preventive services.     Problem List Items Addressed This Visit      Other   Medicare annual wellness visit, subsequent - Primary    Reviewed and updated patient's medical, surgical, family and social history. Medications and allergies were also reviewed. Basic screenings for depression, activities of daily living, hearing, cognition and safety were performed. Provider list was updated and health plan was provided to the patient.       Routine adult health maintenance    1) Anticipatory Guidance: Discussed importance of wearing a seatbelt while driving and not texting while driving; changing batteries in smoke detector at least once annually; wearing suntan lotion when outside; eating a balanced and moderate diet; getting physical activity at least 30 minutes per day.  2) Immunizations / Screenings / Labs:  Prevnar updated today. Pneumovax in 6-12 months. Written prescription for Shingrix series provided with administration instructions. Declines tetanus. All other immunizations are up-to-date per recommendations. Obtain PSA for prostate cancer screening. Due for colon cancer screening with referral to gastroenterology placed. All other screenings are up-to-date per recommendations. Obtain CBC, CMET, and lipid profile.   Overall well exam with risk  factors for cardiovascular disease being minimal at this time. He does have glaucoma. He eats a regular diet that is balanced, moderate, and varied. He exercises regularly. He is of good weight. Overall good health. Continue healthy lifestyle behaviors and choices. Follow-up prevention exam in 1 year. Follow-up office visit pending blood work as needed.        Relevant Orders   CBC   Comprehensive metabolic panel   Lipid panel   PSA    Other Visit Diagnoses    Colon cancer screening       Relevant Orders   Ambulatory referral to Gastroenterology   Need for vaccination with 13-polyvalent pneumococcal conjugate vaccine       Relevant Orders   Pneumococcal conjugate vaccine 13-valent (Completed)  I am having Mr. Wenker maintain his brimonidine-timolol and Zoster Vac Recomb Adjuvanted.   Meds ordered this encounter  Medications  . DISCONTD: Zoster Vac Recomb Adjuvanted [facility]) injection    Sig: Inject 0.5 mLs into the muscle once.    Dispense:  0.5 mL    Refill:  0    Order Specific Question:   Supervising Provider    Answer:   Hillard Danker A [[id number]]  . Zoster Vac Recomb Adjuvanted [facility]) injection    Sig: Inject 0.5 mLs into the muscle once.    Dispense:  0.5 mL    Refill:  0    To be completed 2-6 months after the first injection.    Order Specific Question:   Supervising Provider    Answer:   Hillard Danker A [[id number]]     Follow-up: Return in about 1 year (around 02/20/2018), or if symptoms worsen or fail to improve.   Jeanine Luz, FNP

## 2017-02-20 NOTE — Assessment & Plan Note (Signed)
1) Anticipatory Guidance: Discussed importance of wearing a seatbelt while driving and not texting while driving; changing batteries in smoke detector at least once annually; wearing suntan lotion when outside; eating a balanced and moderate diet; getting physical activity at least 30 minutes per day.  2) Immunizations / Screenings / Labs:  Prevnar updated today. Pneumovax in 6-12 months. Written prescription for Shingrix series provided with administration instructions. Declines tetanus. All other immunizations are up-to-date per recommendations. Obtain PSA for prostate cancer screening. Due for colon cancer screening with referral to gastroenterology placed. All other screenings are up-to-date per recommendations. Obtain CBC, CMET, and lipid profile.   Overall well exam with risk factors for cardiovascular disease being minimal at this time. He does have glaucoma. He eats a regular diet that is balanced, moderate, and varied. He exercises regularly. He is of good weight. Overall good health. Continue healthy lifestyle behaviors and choices. Follow-up prevention exam in 1 year. Follow-up office visit pending blood work as needed.

## 2017-02-20 NOTE — Assessment & Plan Note (Signed)
Reviewed and updated patient's medical, surgical, family and social history. Medications and allergies were also reviewed. Basic screenings for depression, activities of daily living, hearing, cognition and safety were performed. Provider list was updated and health plan was provided to the patient.  

## 2017-02-20 NOTE — Patient Instructions (Signed)
Thank you for choosing [facility].  SUMMARY AND INSTRUCTIONS:  Safe to start the Shingrix (Shingles) series in about 1 month. A second injection will need to be completed 2-6 months after the first injection.  Continue to take your medications as prescribed.   They will call to schedule your appointment for the colonoscopy.  Your health appears very good at present - keep up the great work!   Labs:  Please stop by the lab on the lower level of the building for your blood work. Your results will be released to MyChart (or called to you) after review, usually within 72 hours after test completion. If any changes need to be made, you will be notified at that same time.  1.) The lab is open from 7:30am to 5:30 pm Monday-Friday 2.) No appointment is necessary 3.) Fasting (if needed) is 6-8 hours after food and drink; black coffee and water are okay   Follow up:  If your symptoms worsen or fail to improve, please contact our office for further instruction, or in case of emergency go directly to the emergency room at the closest medical facility.    Health Maintenance  Topic Date Due  . TETANUS/TDAP  10/20/1968  . COLONOSCOPY  10/21/1999  . PNA vac Low Risk Adult (1 of 2 - PCV13) 10/20/2014  . INFLUENZA VACCINE  05/10/2017  . Hepatitis C Screening  Completed     Health Maintenance, Male A healthy lifestyle and preventive care is important for your health and wellness. Ask your health care provider about what schedule of regular examinations is right for you. What should I know about weight and diet?  Eat a Healthy Diet  Eat plenty of vegetables, fruits, whole grains, low-fat dairy products, and lean protein.  Do not eat a lot of foods high in solid fats, added sugars, or salt. Maintain a Healthy Weight  Regular exercise can help you achieve or maintain a healthy weight. You should:  Do at least 150 minutes of exercise each week. The exercise should increase your heart  rate and make you sweat (moderate-intensity exercise).  Do strength-training exercises at least twice a week. Watch Your Levels of Cholesterol and Blood Lipids  Have your blood tested for lipids and cholesterol every 5 years starting at 66 years of age. If you are at high risk for heart disease, you should start having your blood tested when you are 67 years old. You may need to have your cholesterol levels checked more often if:  Your lipid or cholesterol levels are high.  You are older than 67 years of age.  You are at high risk for heart disease. What should I know about cancer screening? Many types of cancers can be detected early and may often be prevented. Lung Cancer  You should be screened every year for lung cancer if:  You are a current smoker who has smoked for at least 30 years.  You are a former smoker who has quit within the past 15 years.  Talk to your health care provider about your screening options, when you should start screening, and how often you should be screened. Colorectal Cancer  Routine colorectal cancer screening usually begins at 67 years of age and should be repeated every 5-10 years until you are 67 years old. You may need to be screened more often if early forms of precancerous polyps or small growths are found. Your health care provider may recommend screening at an earlier age if you have risk  factors for colon cancer.  Your health care provider may recommend using home test kits to check for hidden blood in the stool.  A small camera at the end of a tube can be used to examine your colon (sigmoidoscopy or colonoscopy). This checks for the earliest forms of colorectal cancer. Prostate and Testicular Cancer  Depending on your age and overall health, your health care provider may do certain tests to screen for prostate and testicular cancer.  Talk to your health care provider about any symptoms or concerns you have about testicular or prostate  cancer. Skin Cancer  Check your skin from head to toe regularly.  Tell your health care provider about any new moles or changes in moles, especially if:  There is a change in a mole's size, shape, or color.  You have a mole that is larger than a pencil eraser.  Always use sunscreen. Apply sunscreen liberally and repeat throughout the day.  Protect yourself by wearing long sleeves, pants, a wide-brimmed hat, and sunglasses when outside. What should I know about heart disease, diabetes, and high blood pressure?  If you are 23-10 years of age, have your blood pressure checked every 3-5 years. If you are 34 years of age or older, have your blood pressure checked every year. You should have your blood pressure measured twice-once when you are at a hospital or clinic, and once when you are not at a hospital or clinic. Record the average of the two measurements. To check your blood pressure when you are not at a hospital or clinic, you can use:  An automated blood pressure machine at a pharmacy.  A home blood pressure monitor.  Talk to your health care provider about your target blood pressure.  If you are between 78-35 years old, ask your health care provider if you should take aspirin to prevent heart disease.  Have regular diabetes screenings by checking your fasting blood sugar level.  If you are at a normal weight and have a low risk for diabetes, have this test once every three years after the age of 56.  If you are overweight and have a high risk for diabetes, consider being tested at a younger age or more often.  A one-time screening for abdominal aortic aneurysm (AAA) by ultrasound is recommended for men aged 65-75 years who are current or former smokers. What should I know about preventing infection? Hepatitis B  If you have a higher risk for hepatitis B, you should be screened for this virus. Talk with your health care provider to find out if you are at risk for hepatitis B  infection. Hepatitis C  Blood testing is recommended for:  Everyone born from [birth year] through 1966.  Anyone with known risk factors for hepatitis C. Sexually Transmitted Diseases (STDs)  You should be screened each year for STDs including gonorrhea and chlamydia if:  You are sexually active and are younger than 67 years of age.  You are older than 67 years of age and your health care provider tells you that you are at risk for this type of infection.  Your sexual activity has changed since you were last screened and you are at an increased risk for chlamydia or gonorrhea. Ask your health care provider if you are at risk.  Talk with your health care provider about whether you are at high risk of being infected with HIV. Your health care provider may recommend a prescription medicine to help prevent HIV infection. What else can  I do?  Schedule regular health, dental, and eye exams.  Stay current with your vaccines (immunizations).  Do not use any tobacco products, such as cigarettes, chewing tobacco, and e-cigarettes. If you need help quitting, ask your health care provider.  Limit alcohol intake to no more than 2 drinks per day. One drink equals 12 ounces of beer, 5 ounces of wine, or 1 ounces of hard liquor.  Do not use street drugs.  Do not share needles.  Ask your health care provider for help if you need support or information about quitting drugs.  Tell your health care provider if you often feel depressed.  Tell your health care provider if you have ever been abused or do not feel safe at home. This information is not intended to replace advice given to you by your health care provider. Make sure you discuss any questions you have with your health care provider. Document Released: 03/24/2008 Document Revised: 05/25/2016 Document Reviewed: 06/30/2015 Elsevier Interactive Patient Education  2017 [employer]

## 2017-02-24 ENCOUNTER — Telehealth: Payer: Self-pay | Admitting: Family

## 2017-02-24 NOTE — Telephone Encounter (Signed)
Pt called stating Tanner Thompson wanted to know when his last colonoscopy was,  last Colonoscopy was June 5th, 2014

## 2017-02-28 NOTE — Telephone Encounter (Signed)
Noted  

## 2017-02-28 NOTE — Telephone Encounter (Signed)
Put into pts chart

## 2017-05-04 ENCOUNTER — Ambulatory Visit (INDEPENDENT_AMBULATORY_CARE_PROVIDER_SITE_OTHER): Payer: Medicare HMO | Admitting: Neurology

## 2017-05-04 ENCOUNTER — Encounter: Payer: Self-pay | Admitting: Neurology

## 2017-05-04 VITALS — BP 104/63 | HR 53 | Ht 67.0 in | Wt 172.0 lb

## 2017-05-04 DIAGNOSIS — D61818 Other pancytopenia: Secondary | ICD-10-CM

## 2017-05-04 DIAGNOSIS — H532 Diplopia: Secondary | ICD-10-CM

## 2017-05-04 NOTE — Patient Instructions (Signed)
   We will check MRI of the brain and the neck, consider a spinal tap

## 2017-05-04 NOTE — Progress Notes (Signed)
Reason for visit: Double vision  Tanner DykesBenjamin C Thompson is an 66 y.o. male  History of present illness:  [name] is a 66 year old right-handed black male with a history of double vision that began in March 2018. The patient has had a brainstem lesion in the periaqueductal gray area, no other lesions were seen. The patient had extensive blood work evaluation that showed a mild pancytopenia, he has gone on multivitamins with iron and this seems to have improved the hemoglobin and platelet level, the patient still has a low white blood count. The patient did not wish to do a lumbar puncture to evaluate him for possible demyelinating disease. The patient indicates that within a week after he was seen on 12/28/2016, the patient had good improvement of the double vision, he has not had any recurrence of double vision since that time. He reports no other new symptoms of numbness, weakness, balance changes, cognitive changes, or difficulty controlling the bowels or the bladder. He returns for an evaluation. He does drink alcohol, but he claims that he drinks only occasionally, and not heavily.  Past Medical History:  Diagnosis Date  . Diplopia   . Glaucoma   . Pancytopenia (HCC) 12/28/2016    Past Surgical History:  Procedure Laterality Date  . CATARACT EXTRACTION Bilateral    One eye laser removal, other removed about 15 yr ago  . HEMORROIDECTOMY      Family History  Problem Relation Age of Onset  . Breast cancer Mother   . Prostate cancer Father     Social history:  reports that he has never smoked. He has never used smokeless tobacco. He reports that he drinks alcohol. He reports that he does not use drugs.   No Known Allergies  Medications:  Prior to Admission medications   Medication Sig Start Date End Date Taking? Authorizing Provider  brimonidine-timolol (COMBIGAN) 0.2-0.5 % ophthalmic solution Place 1 drop into both eyes every 12 (twelve) hours.   Yes [provider]     ROS:  Out of a complete 14 system review of symptoms, the patient complains only of the following symptoms, and all other reviewed systems are negative.  Double vision, resolved  Blood pressure 104/63, pulse (!) 53, height 5\' 7"  (1.702 m), weight 172 lb (78 kg).  Physical Exam  General: The patient is alert and cooperative at the time of the examination.  Skin: No significant peripheral edema is noted.   Neurologic Exam  Mental status: The patient is alert and oriented x 3 at the time of the examination. The patient has apparent normal recent and remote memory, with an apparently normal attention span and concentration ability.   Cranial nerves: Facial symmetry is present. Speech is normal, no aphasia or dysarthria is noted. Extraocular movements are full. Visual fields are full. Cover test is negative.  Motor: The patient has good strength in all 4 extremities.  Sensory examination: Soft touch sensation is symmetric on the face, arms, and legs.  Coordination: The patient has good finger-nose-finger and heel-to-shin bilaterally.  Gait and station: The patient has a normal gait. Tandem gait is normal. Romberg is negative. No drift is seen.  Reflexes: Deep tendon reflexes are symmetric.   Assessment/Plan:  1. Double vision, resolved  2. Abnormal MRI brain  3. History of pancytopenia  The lesion seen by MRI did not appear to be consistent with a small vessel ischemic stroke, the possibility of demyelinating disease does need to be considered even though the patient  is 66 years old. The patient be sent for further blood work to include a CBC and a thiamine level. The patient will be set up for a repeat MRI of the brain and cervical spine looking for any new lesions that would suggest demyelinating disease. The studies will be done with and without gadolinium enhancement. The patient clinically has returned to normal, he will follow-up in 4 months. If he desires to have a  lumbar puncture, we will get this set up.  Marlan Palau. Keith Badr Piedra MD 05/04/2017 7:24 AM  [facility] [address] [place], [place] [id number]  Phone [phone] Fax [phone]

## 2017-05-07 LAB — CBC WITH DIFFERENTIAL/PLATELET
BASOS: 1 %
Basophils Absolute: 0 10*3/uL (ref 0.0–0.2)
EOS (ABSOLUTE): 0.2 10*3/uL (ref 0.0–0.4)
EOS: 6 %
HEMOGLOBIN: 12.4 g/dL — AB (ref 13.0–17.7)
Hematocrit: 37.8 % (ref 37.5–51.0)
IMMATURE GRANULOCYTES: 0 %
Immature Grans (Abs): 0 10*3/uL (ref 0.0–0.1)
Lymphocytes Absolute: 1.6 10*3/uL (ref 0.7–3.1)
Lymphs: 45 %
MCH: 25.8 pg — ABNORMAL LOW (ref 26.6–33.0)
MCHC: 32.8 g/dL (ref 31.5–35.7)
MCV: 79 fL (ref 79–97)
MONOS ABS: 0.4 10*3/uL (ref 0.1–0.9)
Monocytes: 10 %
Neutrophils Absolute: 1.4 10*3/uL (ref 1.4–7.0)
Neutrophils: 38 %
Platelets: 154 10*3/uL (ref 150–379)
RBC: 4.8 x10E6/uL (ref 4.14–5.80)
RDW: 14.4 % (ref 12.3–15.4)
WBC: 3.6 10*3/uL (ref 3.4–10.8)

## 2017-05-07 LAB — VITAMIN B1: Thiamine: 87.4 nmol/L (ref 66.5–200.0)

## 2017-05-18 IMAGING — MR MR HEAD WO/W CM
10 of 16 series · 27 of 48 positions shown · IV contrast (Yes   MULTIHANCE)
Comparison: None.

CLINICAL DATA: Double vision and dizziness that worsens head
movements

EXAM:
MRI HEAD AND ORBITS WITHOUT AND WITH CONTRAST
TECHNIQUE: Multiplanar, multiecho pulse sequences of the brain and surrounding
structures were obtained without and with intravenous contrast.
Multiplanar, multiecho pulse sequences of the orbits and surrounding
structures were obtained including fat saturation techniques, before
and after intravenous contrast administration.
CONTRAST:  15mL MULTIHANCE GADOBENATE DIMEGLUMINE 529 MG/ML IV SOLN

[Series 5: DWI · axial · 3.0mm · 1.09mm/px · z∈[-61,+79]mm · 9 of 96 slices shown (1 of 2)]
[im 1/96]
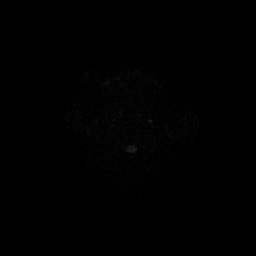
[im 12/96]
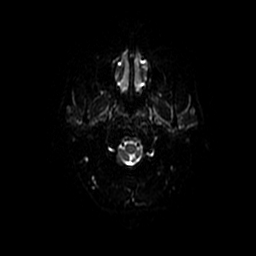
[im 24/96]
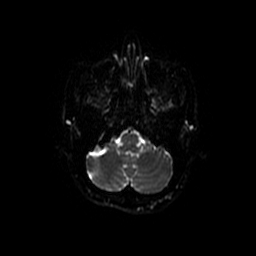
[im 36/96]
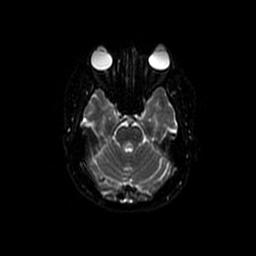
[im 48/96]
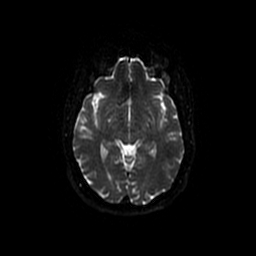
[im 60/96]
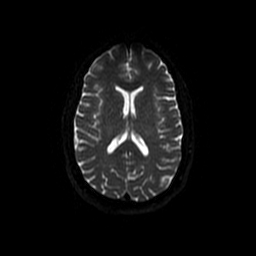
[im 72/96]
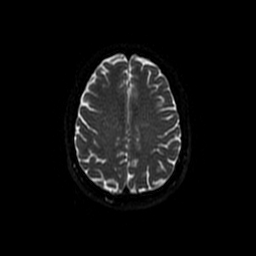
[im 84/96]
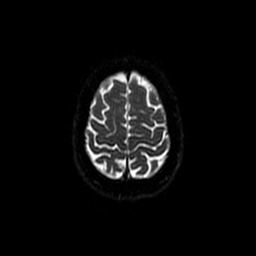
[im 96/96]
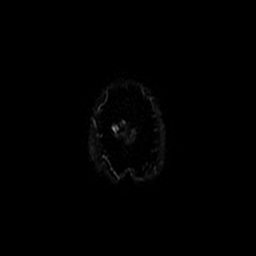

[Series 6: T2 · axial · 5.0mm · 0.43mm/px · z∈[-66,+77]mm · 3 of 25 slices shown]
[im 1/25]
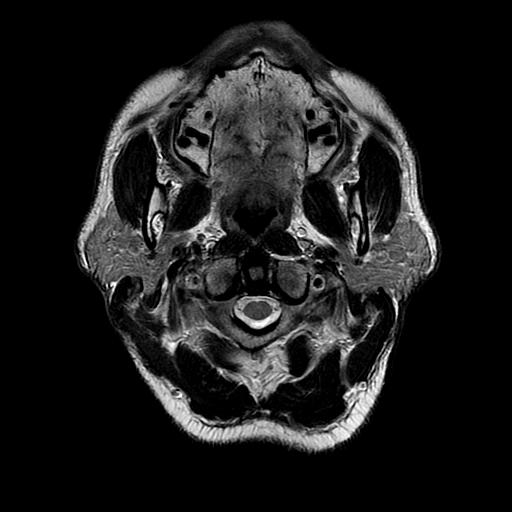
[im 13/25]
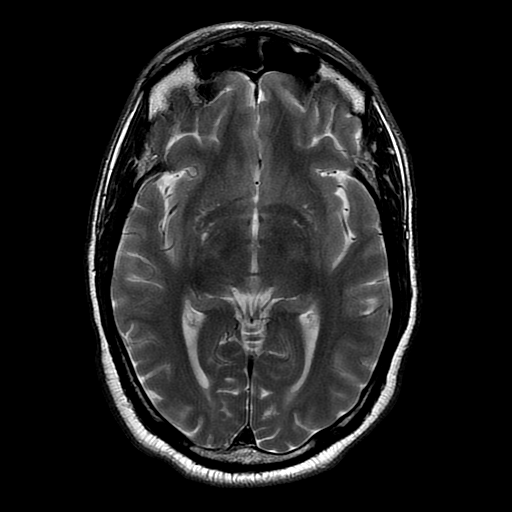
[im 25/25]
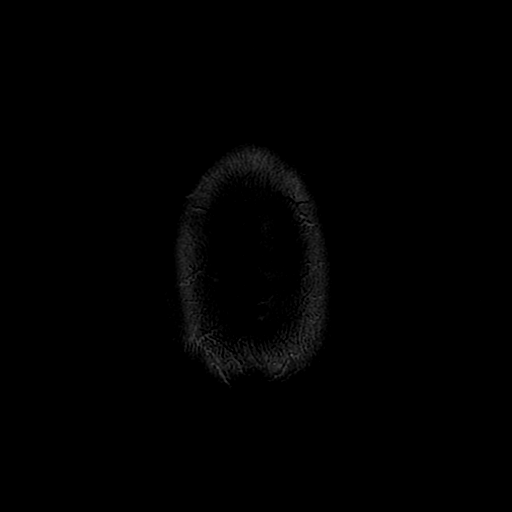

[Series 7: FLAIR · axial · 5.0mm · 0.43mm/px · z∈[-66,+77]mm · 2 of 25 slices shown]
[im 1/25]
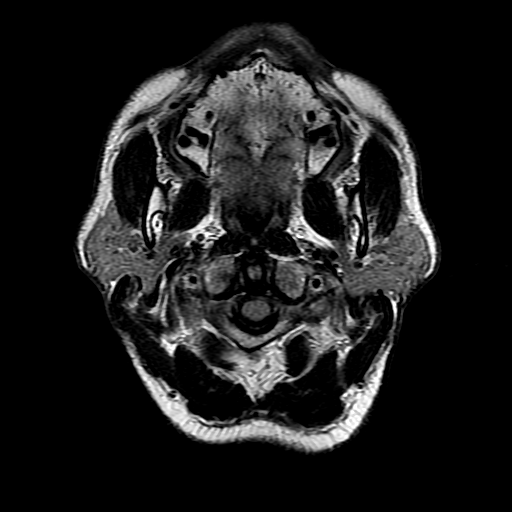
[im 25/25]
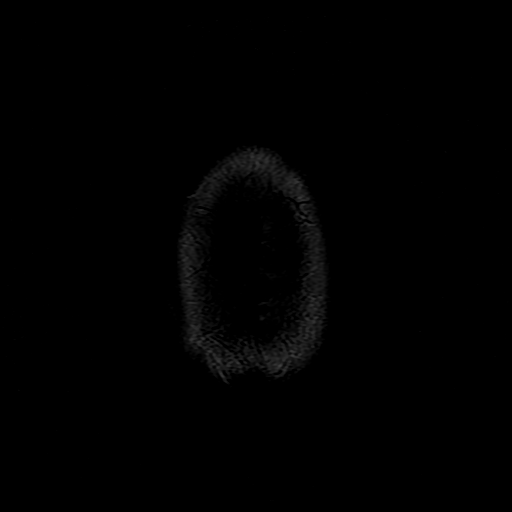

[Series 12: T2 fat-sat · axial · 3.0mm · 0.35mm/px · 1 of 15 slices shown (1 of 2)]
[im 1/15]
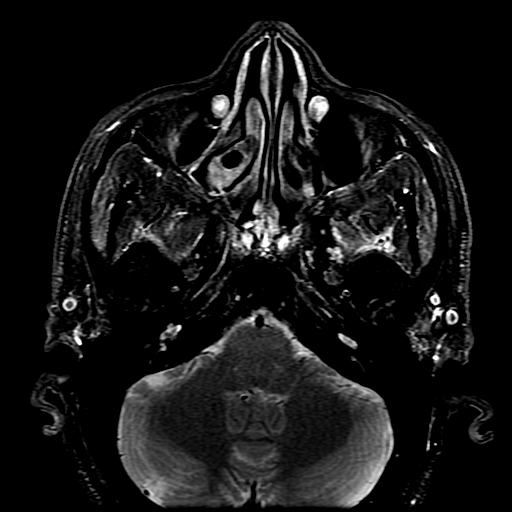

[Series 13: T2 fat-sat · coronal · 3.0mm · 0.35mm/px · 2 of 30 slices shown (2 of 2)]
[im 1/30]
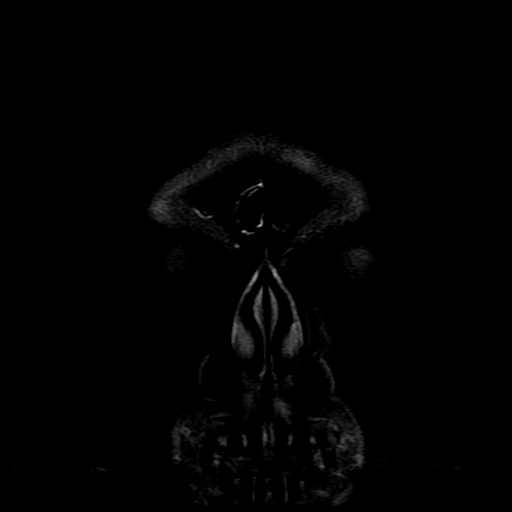
[im 30/30]
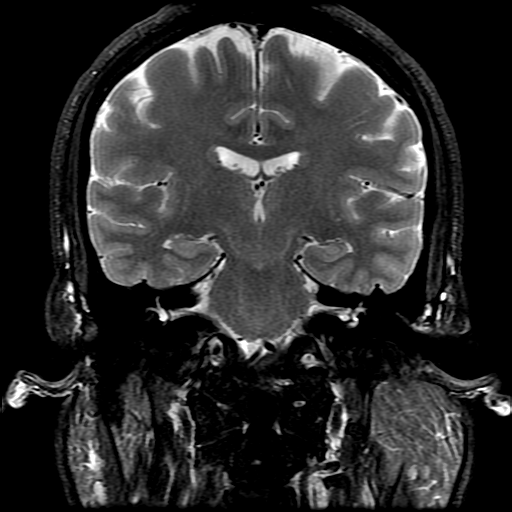

[Series 14: T2 post-contrast · coronal · 5.0mm · 0.39mm/px · 1 of 26 slices shown]
[im 1/26]
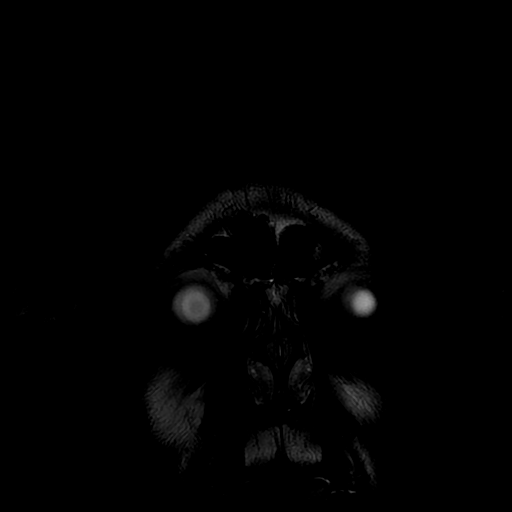

[Series 16: T1 post-contrast · coronal · 5.0mm · 0.43mm/px · 2 of 26 slices shown (1 of 3)]
[im 1/26]
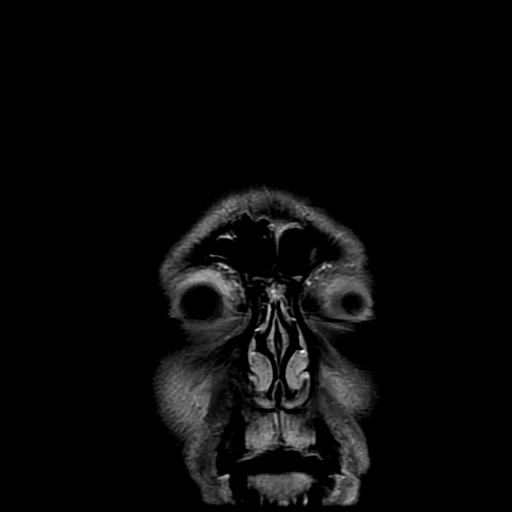
[im 26/26]
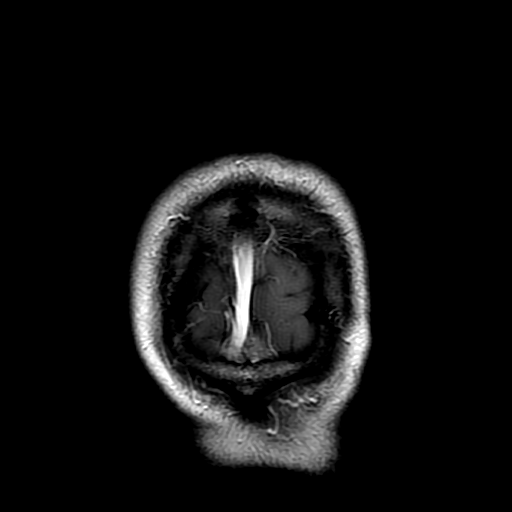

[Series 17: T1 post-contrast · axial · 3.0mm · 0.35mm/px · 1 of 15 slices shown (2 of 3)]
[im 1/15]
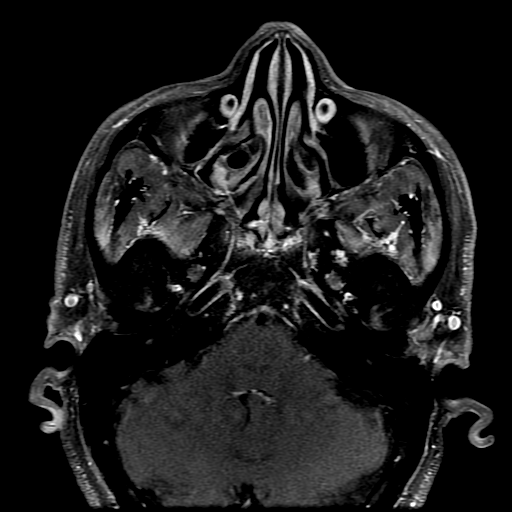

[Series 18: T1 post-contrast · coronal · 3.0mm · 0.35mm/px · 2 of 30 slices shown (3 of 3)]
[im 1/30]
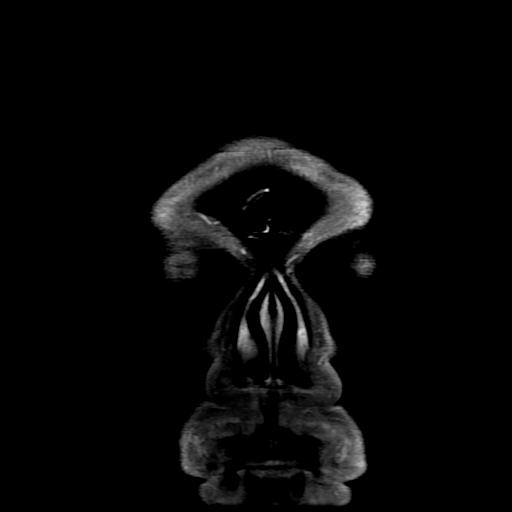
[im 30/30]
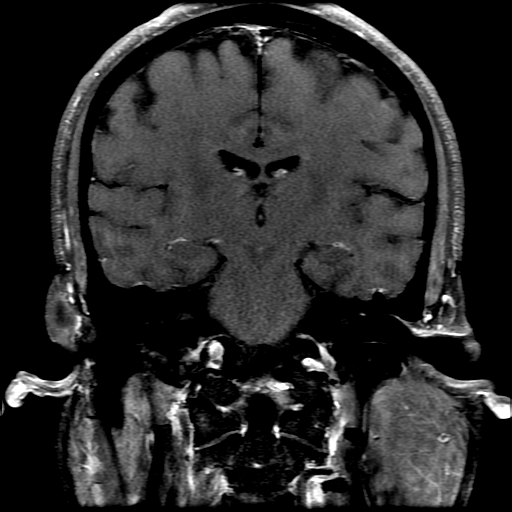

[Series 500: DWI · axial · 3.0mm · 1.09mm/px · z∈[-61,+79]mm · 4 of 48 slices shown (2 of 2)]
[im 1/48]
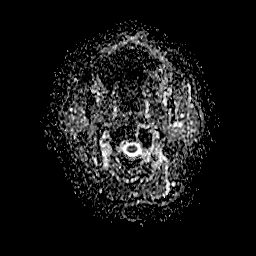
[im 16/48]
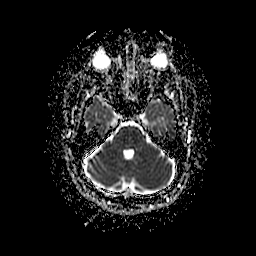
[im 32/48]
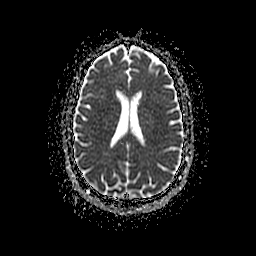
[im 48/48]
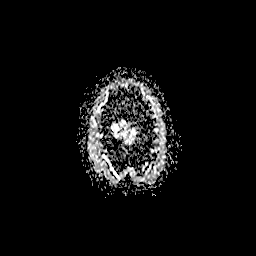

[27 of 48 positions shown; findings below may reference images not displayed]

FINDINGS: MRI HEAD FINDINGS

Brain: No focal diffusion restriction to indicate acute infarct. No
intraparenchymal hemorrhage. There is a focal area of hyperintense
T2 weighted signal within the brainstem posteriorly, adjacent to the
cerebral aqueduct. This is the area of the third cranial nerve
nucleus. No associated contrast enhancement. No midline shift or
other mass effect. No hydrocephalus or extra-axial fluid collection.
No age advanced or lobar predominant atrophy.

Vascular: Major intracranial arterial and venous sinus flow voids
are preserved. No evidence of chronic microhemorrhage or amyloid
angiopathy.

Skull and upper cervical spine: The visualized skull base,
calvarium, upper cervical spine and extracranial soft tissues are
normal.

MRI ORBITS FINDINGS

Orbits: Both optic nerves are normal in caliber and signal. The
optic chiasm is normal. The extraocular muscles and lacrimal fossae
are normal.

Visualized sinuses: No fluid levels or advanced mucosal thickening.

Soft tissues: Normal
IMPRESSION: 1. Hyperintense T2 weighted signal lesion of the posterior
brainstem, at the site of the oculomotor nerve nucleus, with no
contrast enhancement or diffusion restriction. The etiology of this
lesion is uncertain, but the primary differential considerations are
neoplasm, such as a low grade glioma, or demyelinating disease.
Infectious or vascular processes are considered less likely.
2. Normal appearance of the orbits and optic nerves.

## 2017-05-19 ENCOUNTER — Ambulatory Visit
Admission: RE | Admit: 2017-05-19 | Discharge: 2017-05-19 | Disposition: A | Discharge status: Home / Self Care | Payer: Medicare HMO | Source: Ambulatory Visit | Attending: Neurology | Admitting: Neurology

## 2017-05-19 DIAGNOSIS — H532 Diplopia: Secondary | ICD-10-CM | POA: Diagnosis not present

## 2017-05-19 DIAGNOSIS — M5125 Other intervertebral disc displacement, thoracolumbar region: Secondary | ICD-10-CM | POA: Diagnosis not present

## 2017-05-19 MED ORDER — GADOBENATE DIMEGLUMINE 529 MG/ML IV SOLN
17.0000 mL | Freq: Once | INTRAVENOUS | Status: AC | PRN
  Administered 2017-05-19: 17 mL via INTRAVENOUS

## 2017-05-21 ENCOUNTER — Telehealth: Payer: Self-pay | Admitting: Neurology

## 2017-05-21 NOTE — Telephone Encounter (Signed)
I called patient. MRI the brain shows abnormalities in the brainstem that appear to be improved from the prior study done in March 2018.  MRI of the cervical spine shows no cord lesions, no clear evidence of demyelinating disease.    MRI cervical 05/19/17:  IMPRESSION:  This MRI of the cervical spine with and without contrast shows the following: 1.   There are mild disc degenerative changes at C5-C6, C6-C7 and C7-T1 that did not lead to any nerve root compression. 2.    The spinal cord appears normal before and after contrast. There are no acute findings.   MRI brain 05/19/17:   IMPRESSION:  This MRI of the brain with and without contrast shows the following: 1.   T2/FLAIR hyperintense changes in the posterior lower midbrain and upper pons, improved in appearance when compared to the 12/23/2016 MRI. The brain is otherwise normal for age. Although nonspecific, with improvement shown, this likely represents a region of resolving inflammation or demyelination. 2.    Minimal ethmoid chronic sinusitis.

## 2017-05-25 ENCOUNTER — Encounter: Payer: Self-pay | Admitting: Family

## 2017-06-25 ENCOUNTER — Encounter: Payer: Self-pay | Admitting: Family

## 2017-06-29 ENCOUNTER — Encounter: Payer: Self-pay | Admitting: Family

## 2017-07-07 ENCOUNTER — Encounter: Payer: Self-pay | Admitting: Family

## 2017-07-22 ENCOUNTER — Encounter: Payer: Self-pay | Admitting: Family

## 2017-07-24 NOTE — Telephone Encounter (Signed)
LVM for patient to call and sch appt

## 2017-07-27 ENCOUNTER — Encounter: Payer: Self-pay | Admitting: Family

## 2017-07-28 ENCOUNTER — Telehealth: Payer: Self-pay

## 2017-07-28 NOTE — Telephone Encounter (Signed)
Spoke with pt in regards to the [place] message above. Got pt scheduled with Dr. Yetta BarreJones as new pt. Pt is aware.

## 2017-07-28 NOTE — Telephone Encounter (Signed)
yes

## 2017-07-28 NOTE — Telephone Encounter (Signed)
Pt is trying to transfer from [facility] and requested you to be his MD. Will you be willing to take him as a patient? Thanks

## 2017-08-14 DIAGNOSIS — H401130 Primary open-angle glaucoma, bilateral, stage unspecified: Secondary | ICD-10-CM | POA: Diagnosis not present

## 2017-08-17 ENCOUNTER — Ambulatory Visit (INDEPENDENT_AMBULATORY_CARE_PROVIDER_SITE_OTHER): Payer: Medicare HMO | Admitting: Internal Medicine

## 2017-08-17 ENCOUNTER — Encounter: Payer: Self-pay | Admitting: Internal Medicine

## 2017-08-17 VITALS — BP 120/70 | HR 53 | Temp 98.4°F | Resp 16 | Ht 67.0 in | Wt 167.5 lb

## 2017-08-17 DIAGNOSIS — H6123 Impacted cerumen, bilateral: Secondary | ICD-10-CM | POA: Diagnosis not present

## 2017-08-17 DIAGNOSIS — Z23 Encounter for immunization: Secondary | ICD-10-CM

## 2017-08-17 NOTE — Patient Instructions (Signed)
Earwax Buildup, Adult The ears produce a substance called earwax that helps keep bacteria out of the ear and protects the skin in the ear canal. Occasionally, earwax can build up in the ear and cause discomfort or hearing loss. What increases the risk? This condition is more likely to develop in people who:  Are male.  Are elderly.  Naturally produce more earwax.  Clean their ears often with cotton swabs.  Use earplugs often.  Use in-ear headphones often.  Wear hearing aids.  Have narrow ear canals.  Have earwax that is overly thick or sticky.  Have eczema.  Are dehydrated.  Have excess hair in the ear canal.  What are the signs or symptoms? Symptoms of this condition include:  Reduced or muffled hearing.  A feeling of fullness in the ear or feeling that the ear is plugged.  Fluid coming from the ear.  Ear pain.  Ear itch.  Ringing in the ear.  Coughing.  An obvious piece of earwax that can be seen inside the ear canal.  How is this diagnosed? This condition may be diagnosed based on:  Your symptoms.  Your medical history.  An ear exam. During the exam, your health care provider will look into your ear with an instrument called an otoscope.  You may have tests, including a hearing test. How is this treated? This condition may be treated by:  Using ear drops to soften the earwax.  Having the earwax removed by a health care provider. The health care provider may: ? Flush the ear with water. ? Use an instrument that has a loop on the end (curette). ? Use a suction device.  Surgery to remove the wax buildup. This may be done in severe cases.  Follow these instructions at home:  Take over-the-counter and prescription medicines only as told by your health care provider.  Do not put any objects, including cotton swabs, into your ear. You can clean the opening of your ear canal with a washcloth or facial tissue.  Follow instructions from your health  care provider about cleaning your ears. Do not over-clean your ears.  Drink enough fluid to keep your urine clear or pale yellow. This will help to thin the earwax.  Keep all follow-up visits as told by your health care provider. If earwax builds up in your ears often or if you use hearing aids, consider seeing your health care provider for routine, preventive ear cleanings. Ask your health care provider how often you should schedule your cleanings.  If you have hearing aids, clean them according to instructions from the manufacturer and your health care provider. Contact a health care provider if:  You have ear pain.  You develop a fever.  You have blood, pus, or other fluid coming from your ear.  You have hearing loss.  You have ringing in your ears that does not go away.  Your symptoms do not improve with treatment.  You feel like the room is spinning (vertigo). Summary  Earwax can build up in the ear and cause discomfort or hearing loss.  The most common symptoms of this condition include reduced or muffled hearing and a feeling of fullness in the ear or feeling that the ear is plugged.  This condition may be diagnosed based on your symptoms, your medical history, and an ear exam.  This condition may be treated by using ear drops to soften the earwax or by having the earwax removed by a health care provider.  Do   not put any objects, including cotton swabs, into your ear. You can clean the opening of your ear canal with a washcloth or facial tissue. This information is not intended to replace advice given to you by your health care provider. Make sure you discuss any questions you have with your health care provider. Document Released: 11/03/2004 Document Revised: 12/07/2016 Document Reviewed: 12/07/2016 Elsevier Interactive Patient Education  2018 [employer]  

## 2017-08-19 ENCOUNTER — Encounter: Payer: Self-pay | Admitting: Internal Medicine

## 2017-08-19 DIAGNOSIS — H6123 Impacted cerumen, bilateral: Secondary | ICD-10-CM | POA: Insufficient documentation

## 2017-08-19 NOTE — Progress Notes (Signed)
Subjective:  Patient ID: Tanner Thompson, male    DOB: 02/12/1951  Age: 66 y.o. MRN: [id number]  CC: Hearing Problem   HPI Tanner Thompson presents for concerns about a gradual decline in his hearing over the last few weeks.  Outpatient Medications Prior to Visit  Medication Sig Dispense Refill  . brimonidine-timolol (COMBIGAN) 0.2-0.5 % ophthalmic solution Place 1 drop into both eyes every 12 (twelve) hours.     No facility-administered medications prior to visit.     ROS Review of Systems  Constitutional: Negative.   HENT: Positive for hearing loss. Negative for ear discharge, ear pain, facial swelling, postnasal drip, sinus pressure, sore throat and trouble swallowing.   Eyes: Negative.   Respiratory: Negative.  Negative for cough, chest tightness and shortness of breath.   Cardiovascular: Negative.  Negative for chest pain, palpitations and leg swelling.  Gastrointestinal: Negative for abdominal pain.  Endocrine: Negative.   Genitourinary: Negative.   Musculoskeletal: Negative.  Negative for back pain and neck pain.  Skin: Negative.   Hematological: Negative.   Psychiatric/Behavioral: Negative.     Objective:  BP 120/70 (BP Location: Left Arm, Patient Position: Sitting, Cuff Size: Normal)   Pulse (!) 53   Temp 98.4 F (36.9 C) (Oral)   Resp 16   Ht 5\' 7"  (1.702 m)   Wt 167 lb 8 oz (76 kg)   SpO2 99%   BMI 26.23 kg/m   BP Readings from Last 3 Encounters:  08/17/17 120/70  05/04/17 104/63  02/20/17 108/66    Wt Readings from Last 3 Encounters:  08/17/17 167 lb 8 oz (76 kg)  05/04/17 172 lb (78 kg)  02/20/17 166 lb (75.3 kg)    Physical Exam  Constitutional: No distress.  HENT:  Mouth/Throat: Oropharynx is clear and moist. No oropharyngeal exudate.  There were cerumen impactions in both ears.  I put Colace in the EACs and then I irrigated it with water and used an ear pick to remove the wax.  After this his hearing is normal.  Examination at this time  is normal as well.  Eyes: Conjunctivae are normal.  Neck: Normal range of motion. Neck supple.  Cardiovascular: Normal rate, regular rhythm and intact distal pulses.  No murmur heard. Pulmonary/Chest: Effort normal and breath sounds normal. No respiratory distress. He has no wheezes.  Abdominal: Soft. Bowel sounds are normal.  Skin: He is not diaphoretic.  Vitals reviewed.   Lab Results  Component Value Date   WBC 3.6 05/04/2017   HGB 12.4 (L) 05/04/2017   HCT 37.8 05/04/2017   PLT 154 05/04/2017   GLUCOSE 106 (H) 02/20/2017   CHOL 180 02/20/2017   TRIG 63.0 02/20/2017   HDL 87.00 02/20/2017   LDLCALC 80 02/20/2017   ALT 16 02/20/2017   AST 17 02/20/2017   NA 140 02/20/2017   K 4.6 02/20/2017   CL 107 02/20/2017   CREATININE 1.13 02/20/2017   BUN 15 02/20/2017   CO2 29 02/20/2017   PSA 0.44 02/20/2017    Mr Brain W Wo Contrast  Result Date: 05/19/2017  [facility] [address], [address] [place], [place] [address] [phone] NEUROIMAGING REPORT STUDY DATE: 05/19/2017 PATIENT NAME: Tanner Thompson DOB: [birth year] MRN: [id number] EXAM: MRI of the cervical spine with and without contrast ORDERING CLINICIAN: York Spaniel M.D. CLINICAL HISTORY: 66 year old man with diplopia and abnormal brain MRI, rule out demyelination COMPARISON FILMS: None TECHNIQUE: MRI of the cervical spine was obtained utilizing 3  mm sagittal slices from the posterior fossa down to the T3-4 level with T1, T2 and inversion recovery views. In addition 4 mm axial slices from C2-3 down to T1-2 level were included with T2 and gradient echo views. The infusion of contrast with her, additional T1-weighted images were performed. CONTRAST: 17 mL MultiHance IMAGING SITE: [facility], [address], [place], [place] FINDINGS: :  On sagittal images, the spine is imaged from above the cervicomedullary junction to T2.   The spinal cord is of normal caliber and signal.   The vertebral bodies are  normally aligned.   The vertebral bodies have normal signal.  The discs and interspaces were further evaluated on axial views from C2 to T1 as follows: C2 - C3:  The disc and interspace appear normal. C3 - C4:  The disc and interspace appear normal. C4 - C5:  The disc and interspace appear normal. C5 - C6:  There is mild disc bulging and minimal uncovertebral spurring causing mild foraminal narrowing. There is no nerve root compression. C6 - C7:  There is minimal disc bulging. Neural foraminal are mildly narrowed but there is no nerve root compression. C7 - T1:   There is mild disc bulging that does not lead to any nerve root compression.. T1 - T2:   The disc and interspace appear normal. After the infusion of contrast material, additional T1-weighted images were performed.    This MRI of the cervical spine with and without contrast shows the following: 1.   There are mild disc degenerative changes at C5-C6, C6-C7 and C7-T1 that did not lead to any nerve root compression. 2.    The spinal cord appears normal before and after contrast. There are no acute findings. INTERPRETING PHYSICIAN: Richard A. Epimenio FootSater, MD, PhD, FAAN Certified in  Neuroimaging by American Society of Neuroimaging   Mr Cervical Spine W Wo Contrast  Result Date: 05/19/2017  [facility] [address], [address] [place], [place] [id number] [phone] NEUROIMAGING REPORT STUDY DATE: 05/19/2017 PATIENT NAME: Tanner DykesBenjamin C Thompson DOB: [birth year] MRN: [id number] EXAM: MRI Brain with and without contrast ORDERING CLINICIAN: York Spanielharles K Willis M.D. CLINICAL HISTORY: 66 year old man with diplopia COMPARISON FILMS: 12/23/2016 TECHNIQUE:MRI of the brain with and without contrast was obtained utilizing 5 mm axial slices with T1, T2, T2 flair, SWI and diffusion weighted views.  T1 sagittal, T2 coronal and postcontrast views in the axial and coronal plane were obtained. CONTRAST: 17 ml Multihance IMAGING SITE: [facility], [address] FINDINGS: On sagittal images, the spinal cord is imaged caudally to C3-C4 and is normal in caliber.   The contents of the posterior fossa are of normal size and position.   The pituitary gland and optic chiasm appear normal.    Brain volume appears normal.   The ventricles are normal in size and without distortion.  There are no abnormal extra-axial collections of fluid.  There is mild hyperintensity on T2-weighted and FLAIR images within the posterior lower midbrain and upper pons. When compared to the MRI dated 12/23/2016, edges are more subtle on the current exam. As before, there is no abnormal enhancement and the region does not show restriction on diffusion-weighted images. The cerebellum appears normal.   The deep gray matter appears normal.  The cerebral hemispheres appear normal.   Diffusion weighted images are normal.  Susceptibility weighted images are normal.   The orbits appear normal.   The VIIth/VIIIth nerve complex appears normal.  The mastoid air cells appear normal.  Some of the ethmoid air cells show mild mucoperiosteal thickening. The other paranasal sinuses appear normal.  Flow voids are identified within the major intracerebral arteries.  After the infusion of contrast material, a normal enhancement pattern is noted.    This MRI of the brain with and without contrast shows the following: 1.   T2/FLAIR hyperintense changes in the posterior lower midbrain and upper pons, improved in appearance when compared to the 12/23/2016 MRI. The brain is otherwise normal for age. Although nonspecific, with improvement shown, this likely represents a region of resolving inflammation or demyelination. 2.    Minimal ethmoid chronic sinusitis. INTERPRETING PHYSICIAN: Richard A. Epimenio FootSater, MD, PhD, FAAN Certified in  Neuroimaging by [employer]    Assessment & Plan:   Tanner Thompson was seen today for hearing problem.  Diagnoses and all orders for this visit:  Need for Tdap  vaccination -     Tdap vaccine greater than or equal to 7yo IM  Hearing loss due to cerumen impaction, bilateral- cerumen impactions removed and the hearing returned to normal.  He was given patient education about cerumen impactions.   I am having Tanner Thompson C. [employer] "Ben" maintain his brimonidine-timolol.  No orders of the defined types were placed in this encounter.    Follow-up: Return if symptoms worsen or fail to improve.  Sanda Lingerhomas Adele Milson, MD

## 2017-08-22 ENCOUNTER — Encounter: Payer: Self-pay | Admitting: Internal Medicine

## 2017-08-29 ENCOUNTER — Ambulatory Visit: Payer: Medicare HMO | Admitting: Neurology

## 2017-09-05 ENCOUNTER — Encounter: Payer: Self-pay | Admitting: Internal Medicine

## 2018-01-01 ENCOUNTER — Ambulatory Visit: Payer: Medicare HMO | Admitting: Neurology

## 2018-01-01 ENCOUNTER — Other Ambulatory Visit: Payer: Self-pay

## 2018-01-01 ENCOUNTER — Encounter: Payer: Self-pay | Admitting: Neurology

## 2018-01-01 VITALS — BP 108/60 | HR 55 | Ht 67.0 in | Wt 166.0 lb

## 2018-01-01 DIAGNOSIS — H532 Diplopia: Secondary | ICD-10-CM

## 2018-01-01 NOTE — Progress Notes (Signed)
Reason for visit: Double vision  Tanner Thompson Tanner Thompson is an 67 y.o. male  History of present illness:  Tanner Thompson is a 67 year old right-handed black male with a history of double vision that occurred about 1 year ago.  The patient has improved with double vision without recurrence.  The patient has had MRI evaluation that showed a lesion in the periaqueductal gray area, but blood work did not show any abnormalities or etiology for the lesion.  The patient has had MRI of the brain done in August 2018 along with MRI of the cervical spine.  This did not show any new lesions, no lesions within the spinal cord were noted.  The patient did not wish to have a lumbar puncture.  He returns for an evaluation.  He has not noted any new symptoms.  Past Medical History:  Diagnosis Date  . Diplopia   . Glaucoma   . Pancytopenia (HCC) 12/28/2016    Past Surgical History:  Procedure Laterality Date  . CATARACT EXTRACTION Bilateral    One eye laser removal, other removed about 15 yr ago  . HEMORROIDECTOMY      Family History  Problem Relation Age of Onset  . Breast cancer Mother   . Prostate cancer Father     Social history:  reports that he has never smoked. He has never used smokeless tobacco. He reports that he drinks alcohol. He reports that he does not use drugs.   No Known Allergies  Medications:  Prior to Admission medications   Medication Sig Start Date End Date Taking? Authorizing Provider  brimonidine-timolol (COMBIGAN) 0.2-0.5 % ophthalmic solution Place 1 drop into both eyes every 12 (twelve) hours.   Yes [provider]    ROS:  Out of a complete 14 system review of symptoms, the patient complains only of the following symptoms, and all other reviewed systems are negative.  Ringing in the ears Daytime sleepiness, snoring Environmental allergies Frequency of urination Joint pain  Blood pressure 108/60, pulse (!) 55, height 5\' 7"  (1.702 m), weight 166 lb (75.3 kg),  SpO2 96 %.  Physical Exam  General: The patient is alert and cooperative at the time of the examination.  Skin: No significant peripheral edema is noted.   Neurologic Exam  Mental status: The patient is alert and oriented x 3 at the time of the examination. The patient has apparent normal recent and remote memory, with an apparently normal attention span and concentration ability.   Cranial nerves: Facial symmetry is present. Speech is normal, no aphasia or dysarthria is noted. Extraocular movements are full. Visual fields are full.  Motor: The patient has good strength in all 4 extremities.  Sensory examination: Soft touch sensation is symmetric on the face, arms, and legs.  Coordination: The patient has good finger-nose-finger and heel-to-shin bilaterally.  Gait and station: The patient has a normal gait. Tandem gait is normal. Romberg is negative. No drift is seen.  Reflexes: Deep tendon reflexes are symmetric.   MRI cervical 05/19/17:  IMPRESSION: This MRI of the cervical spine with and without contrast shows the following: 1. There are mild disc degenerative changes at C5-C6, C6-C7 and C7-T1 that did not lead to any nerve root compression. 2. The spinal cord appears normal before and after contrast. There are no acute findings.   MRI brain 05/19/17:   IMPRESSION: This MRI of the brain with and without contrast shows the following: 1. T2/FLAIR hyperintense changes in the posterior lower midbrain and upper  pons, improved in appearance when compared to the 12/23/2016 MRI. The brain is otherwise normal for age. Although nonspecific, with improvement shown, this likely represents a region of resolving inflammation or demyelination. 2. Minimal ethmoid chronic sinusitis.  * MRI scan images were reviewed online. I agree with the written report.   Assessment/Plan:  1.  Double vision, resolved  The patient is doing well at this time.  We will follow him  conservatively.  I will call him in about 6 months to repeat the MRI of the brain.  He will call me if any new symptoms arise.  Marlan Palau MD 01/01/2018 12:02 PM  [facility] [address] [place], [place] [address]  Phone [phone] Fax [phone]

## 2018-03-07 ENCOUNTER — Encounter: Payer: Self-pay | Admitting: Internal Medicine

## 2018-04-05 ENCOUNTER — Encounter: Payer: Self-pay | Admitting: Internal Medicine

## 2018-04-06 ENCOUNTER — Ambulatory Visit (INDEPENDENT_AMBULATORY_CARE_PROVIDER_SITE_OTHER): Payer: Medicare HMO | Admitting: *Deleted

## 2018-04-06 VITALS — BP 106/54 | HR 62 | Resp 18 | Ht 67.0 in | Wt 161.0 lb

## 2018-04-06 DIAGNOSIS — Z0000 Encounter for general adult medical examination without abnormal findings: Secondary | ICD-10-CM | POA: Diagnosis not present

## 2018-04-06 NOTE — Progress Notes (Addendum)
Subjective:   Tanner Thompson is a 67 y.o. male who presents for Medicare Annual/Subsequent preventive examination.  Review of Systems:  No ROS.  Medicare Wellness Visit. Additional risk factors are reflected in the social history.' Cardiac Risk Factors include: advanced age (>68men, >88 women);male gender Sleep patterns: feels rested on waking, gets up 1-2 times nightly to void and sleeps 7 hours nightly.    Home Safety/Smoke Alarms: Feels safe in home. Smoke alarms in place.  Living environment; residence and Firearm Safety: 1-story house/ trailer, no firearms Lives with wife, no needs for DME, good support system. Seat Belt Safety/Bike Helmet: Wears seat belt.   PSA-  Lab Results  Component Value Date   PSA 0.44 02/20/2017       Objective:    Vitals: BP (!) 106/54   Pulse 62   Resp 18   Ht 5\' 7"  (1.702 m)   Wt 161 lb (73 kg)   SpO2 99%   BMI 25.22 kg/m   Body mass index is 25.22 kg/m.  Advanced Directives 04/06/2018 12/23/2016  Does Patient Have a Medical Advance Directive? No No  Would patient like information on creating a medical advance directive? Yes (ED - Information included in AVS) Yes (ED - Information included in AVS)    Tobacco Social History   Tobacco Use  Smoking Status Never Smoker  Smokeless Tobacco Never Used     Counseling given: Not Answered  Past Medical History:  Diagnosis Date  . Diplopia   . Glaucoma   . Pancytopenia (HCC) 12/28/2016   Past Surgical History:  Procedure Laterality Date  . CATARACT EXTRACTION Bilateral    One eye laser removal, other removed about 15 yr ago  . HEMORROIDECTOMY     Family History  Problem Relation Age of Onset  . Breast cancer Mother   . Prostate cancer Father    Social History   Socioeconomic History  . Marital status: Married    Spouse name: Tanner Thompson  . Number of children: 2  . Years of education: 76  . Highest education level: Not on file  Occupational History  . Occupation: Retired - Firefighter   Social Needs  . Financial resource strain: Not hard at all  . Food insecurity:    Worry: Never true    Inability: Never true  . Transportation needs:    Medical: No    Non-medical: No  Tobacco Use  . Smoking status: Never Smoker  . Smokeless tobacco: Never Used  Substance and Sexual Activity  . Alcohol use: Yes    Comment: Occasional  . Drug use: No  . Sexual activity: Yes  Lifestyle  . Physical activity:    Days per week: 5 days    Minutes per session: 50 min  . Stress: Not at all  Relationships  . Social connections:    Talks on phone: More than three times a week    Gets together: More than three times a week    Attends religious service: More than 4 times per year    Active member of club or organization: Yes    Attends meetings of clubs or organizations: Not on file    Relationship status: Married  Other Topics Concern  . Not on file  Social History Narrative   Lives with spouse   Fun: Reading, art, physical activity    Outpatient Encounter Medications as of 04/06/2018  Medication Sig  . calcium-vitamin D (OSCAL WITH D) 500-200 MG-UNIT tablet Take 1 tablet  by mouth daily.  Marland Kitchen. co-enzyme Q-10 30 MG capsule Take 30 mg by mouth daily.  . brimonidine-timolol (COMBIGAN) 0.2-0.5 % ophthalmic solution Place 1 drop into both eyes every 12 (twelve) hours.   No facility-administered encounter medications on file as of 04/06/2018.     Activities of Daily Living In your present state of health, do you have any difficulty performing the following activities: 04/06/2018  Hearing? N  Vision? N  Difficulty concentrating or making decisions? N  Walking or climbing stairs? N  Dressing or bathing? N  Doing errands, shopping? N  Preparing Food and eating ? N  Using the Toilet? N  In the past six months, have you accidently leaked urine? N  Do you have problems with loss of bowel control? N  Managing your Medications? N  Managing your Finances? N  Housekeeping or  managing your Housekeeping? N  Some recent data might be hidden    Patient Care Team: Etta GrandchildJones, Thomas L, MD as PCP - General (Internal Medicine) York SpanielWillis, Charles K, MD as Consulting Physician (Neurology)   Assessment:   This is a routine wellness examination for Tanner SalinaBenjamin. Physical assessment deferred to PCP.   Exercise Activities and Dietary recommendations Current Exercise Habits: Home exercise routine;Structured exercise class, Type of exercise: walking;yoga(zumba), Exercise limited by: None identified  Diet (meal preparation, eat out, water intake, caffeinated beverages, dairy products, fruits and vegetables): in general, a "healthy" diet  , well balanced eats a variety of fruits and vegetables daily, limits salt, fat/cholesterol, sugar,carbohydrates,caffeine, drinks 6-8 glasses of water daily.    Goals    . Patient Stated     Continue to volunteer in the community, assist and support my family, enjoy life and family. Continue to exercise and eat healthy.       Fall Risk Fall Risk  04/06/2018 02/20/2017 11/09/2016  Falls in the past year? No No No   Depression Screen PHQ 2/9 Scores 04/06/2018 02/20/2017 11/09/2016  PHQ - 2 Score 0 0 0    Cognitive Function       Ad8 score reviewed for issues:  Issues making decisions: no  Less interest in hobbies / activities: no  Repeats questions, stories (family complaining): no  Trouble using ordinary gadgets (microwave, computer, phone):no  Forgets the month or year: no  Mismanaging finances: no  Remembering appts: no  Daily problems with thinking and/or memory: no Ad8 score is= 0  Immunization History  Administered Date(s) Administered  . Pneumococcal Conjugate-13 02/20/2017  . Pneumococcal Polysaccharide-23 08/13/2015  . Tdap 08/17/2017  . Zoster Recombinat (Shingrix) 06/05/2017   Screening Tests Health Maintenance  Topic Date Due  . INFLUENZA VACCINE  05/10/2018  . COLONOSCOPY  03/15/2023  . TETANUS/TDAP   08/18/2027  . Hepatitis C Screening  Completed  . PNA vac Low Risk Adult  Completed      Plan:     Continue doing brain stimulating activities (puzzles, reading, adult coloring books, staying active) to keep memory sharp.   Continue to eat heart healthy diet (full of fruits, vegetables, whole grains, lean protein, water--limit salt, fat, and sugar intake) and increase physical activity as tolerated.  I have personally reviewed and noted the following in the patient's chart:   . Medical and social history . Use of alcohol, tobacco or illicit drugs  . Current medications and supplements . Functional ability and status . Nutritional status . Physical activity . Advanced directives . List of other physicians . Vitals . Screenings to include cognitive, depression, and falls .  Referrals and appointments  In addition, I have reviewed and discussed with patient certain preventive protocols, quality metrics, and best practice recommendations. A written personalized care plan for preventive services as well as general preventive health recommendations were provided to patient.     Wanda Plump, RN  04/06/2018  Medical screening examination/treatment/procedure(s) were performed by non-physician practitioner and as supervising physician I was immediately available for consultation/collaboration. I agree with above. Pincus Sanes, MD

## 2018-04-06 NOTE — Patient Instructions (Addendum)
Continue doing brain stimulating activities (puzzles, reading, adult coloring books, staying active) to keep memory sharp.   Continue to eat heart healthy diet (full of fruits, vegetables, whole grains, lean protein, water--limit salt, fat, and sugar intake) and increase physical activity as tolerated.   [name] , Thank you for taking time to come for your Medicare Wellness Visit. I appreciate your ongoing commitment to your health goals. Please review the following plan we discussed and let me know if I can assist you in the future.   These are the goals we discussed: Goals    . Patient Stated     Continue to volunteer in the community, assist and support my family, enjoy life and family. Continue to exercise and eat healthy.       This is a list of the screening recommended for you and due dates:  Health Maintenance  Topic Date Due  . Flu Shot  05/10/2018  . Colon Cancer Screening  03/15/2023  . Tetanus Vaccine  08/18/2027  .  Hepatitis C: One time screening is recommended by Center for Disease Control  (CDC) for  adults born from [birth year]1945 through 1965.   Completed  . Pneumonia vaccines  Completed     Health Maintenance, Male A healthy lifestyle and preventive care is important for your health and wellness. Ask your health care provider about what schedule of regular examinations is right for you. What should I know about weight and diet? Eat a Healthy Diet  Eat plenty of vegetables, fruits, whole grains, low-fat dairy products, and lean protein.  Do not eat a lot of foods high in solid fats, added sugars, or salt.  Maintain a Healthy Weight Regular exercise can help you achieve or maintain a healthy weight. You should:  Do at least 150 minutes of exercise each week. The exercise should increase your heart rate and make you sweat (moderate-intensity exercise).  Do strength-training exercises at least twice a week.  Watch Your Levels of Cholesterol and Blood Lipids  Have  your blood tested for lipids and cholesterol every 5 years starting at 68 years of age. If you are at high risk for heart disease, you should start having your blood tested when you are 68 years old. You may need to have your cholesterol levels checked more often if: ? Your lipid or cholesterol levels are high. ? You are older than 68 years of age. ? You are at high risk for heart disease.  What should I know about cancer screening? Many types of cancers can be detected early and may often be prevented. Lung Cancer  You should be screened every year for lung cancer if: ? You are a current smoker who has smoked for at least 30 years. ? You are a former smoker who has quit within the past 15 years.  Talk to your health care provider about your screening options, when you should start screening, and how often you should be screened.  Colorectal Cancer  Routine colorectal cancer screening usually begins at 68 years of age and should be repeated every 5-10 years until you are 10241 years old. You may need to be screened more often if early forms of precancerous polyps or small growths are found. Your health care provider may recommend screening at an earlier age if you have risk factors for colon cancer.  Your health care provider may recommend using home test kits to check for hidden blood in the stool.  A small camera at the end  of a tube can be used to examine your colon (sigmoidoscopy or colonoscopy). This checks for the earliest forms of colorectal cancer.  Prostate and Testicular Cancer  Depending on your age and overall health, your health care provider may do certain tests to screen for prostate and testicular cancer.  Talk to your health care provider about any symptoms or concerns you have about testicular or prostate cancer.  Skin Cancer  Check your skin from head to toe regularly.  Tell your health care provider about any new moles or changes in moles, especially if: ? There is  a change in a mole's size, shape, or color. ? You have a mole that is larger than a pencil eraser.  Always use sunscreen. Apply sunscreen liberally and repeat throughout the day.  Protect yourself by wearing long sleeves, pants, a wide-brimmed hat, and sunglasses when outside.  What should I know about heart disease, diabetes, and high blood pressure?  If you are 70-72 years of age, have your blood pressure checked every 3-5 years. If you are 36 years of age or older, have your blood pressure checked every year. You should have your blood pressure measured twice-once when you are at a hospital or clinic, and once when you are not at a hospital or clinic. Record the average of the two measurements. To check your blood pressure when you are not at a hospital or clinic, you can use: ? An automated blood pressure machine at a pharmacy. ? A home blood pressure monitor.  Talk to your health care provider about your target blood pressure.  If you are between 52-42 years old, ask your health care provider if you should take aspirin to prevent heart disease.  Have regular diabetes screenings by checking your fasting blood sugar level. ? If you are at a normal weight and have a low risk for diabetes, have this test once every three years after the age of 98. ? If you are overweight and have a high risk for diabetes, consider being tested at a younger age or more often.  A one-time screening for abdominal aortic aneurysm (AAA) by ultrasound is recommended for men aged 65-75 years who are current or former smokers. What should I know about preventing infection? Hepatitis B If you have a higher risk for hepatitis B, you should be screened for this virus. Talk with your health care provider to find out if you are at risk for hepatitis B infection. Hepatitis C Blood testing is recommended for:  Everyone born from [birth year] through 1966.  Anyone with known risk factors for hepatitis C.  Sexually  Transmitted Diseases (STDs)  You should be screened each year for STDs including gonorrhea and chlamydia if: ? You are sexually active and are younger than 68 years of age. ? You are older than 68 years of age and your health care provider tells you that you are at risk for this type of infection. ? Your sexual activity has changed since you were last screened and you are at an increased risk for chlamydia or gonorrhea. Ask your health care provider if you are at risk.  Talk with your health care provider about whether you are at high risk of being infected with HIV. Your health care provider may recommend a prescription medicine to help prevent HIV infection.  What else can I do?  Schedule regular health, dental, and eye exams.  Stay current with your vaccines (immunizations).  Do not use any tobacco products, such as cigarettes,  chewing tobacco, and e-cigarettes. If you need help quitting, ask your health care provider.  Limit alcohol intake to no more than 2 drinks per day. One drink equals 12 ounces of beer, 5 ounces of Jovan Colligan, or 1 ounces of hard liquor.  Do not use street drugs.  Do not share needles.  Ask your health care provider for help if you need support or information about quitting drugs.  Tell your health care provider if you often feel depressed.  Tell your health care provider if you have ever been abused or do not feel safe at home. This information is not intended to replace advice given to you by your health care provider. Make sure you discuss any questions you have with your health care provider. Document Released: 03/24/2008 Document Revised: 05/25/2016 Document Reviewed: 06/30/2015 Elsevier Interactive Patient Education  [employer].

## 2018-06-29 ENCOUNTER — Telehealth: Payer: Self-pay | Admitting: Neurology

## 2018-06-29 NOTE — Telephone Encounter (Signed)
I called the patient.  If he is amenable to it, we will repeat the MRI of the brain done about a year ago with and without gadolinium enhancement, we may need blood work to document renal function before the study.  He will call our office if he is amenable to having the study done.

## 2018-06-29 NOTE — Telephone Encounter (Signed)
-----   Message from York Spanielharles K Cuauhtemoc Huegel, MD sent at 01/01/2018 12:08 PM EDT ----- Repeat MRI of the brain

## 2018-07-19 ENCOUNTER — Encounter: Payer: Self-pay | Admitting: Internal Medicine

## 2018-07-20 ENCOUNTER — Encounter: Payer: Self-pay | Admitting: Internal Medicine

## 2018-07-30 ENCOUNTER — Other Ambulatory Visit: Payer: Self-pay | Admitting: Neurology

## 2018-07-30 ENCOUNTER — Telehealth: Payer: Self-pay | Admitting: Neurology

## 2018-07-30 DIAGNOSIS — H532 Diplopia: Secondary | ICD-10-CM

## 2018-07-30 DIAGNOSIS — Z5181 Encounter for therapeutic drug level monitoring: Secondary | ICD-10-CM

## 2018-07-30 DIAGNOSIS — R9089 Other abnormal findings on diagnostic imaging of central nervous system: Secondary | ICD-10-CM

## 2018-07-30 NOTE — Telephone Encounter (Signed)
Francine Graven Berkley Harvey: [id number] (exp. 07/30/18 to 08/29/18) order sent to GI. Spoke to patient he is aware of this.. I also gave him GI phone number of [phone] and to call if he has not heard anything int he next 2-3 business days.

## 2018-08-14 ENCOUNTER — Ambulatory Visit
Admission: RE | Admit: 2018-08-14 | Discharge: 2018-08-14 | Disposition: A | Discharge status: Home / Self Care | Payer: Medicare HMO | Source: Ambulatory Visit | Attending: Neurology | Admitting: Neurology

## 2018-08-14 DIAGNOSIS — H532 Diplopia: Secondary | ICD-10-CM

## 2018-08-14 DIAGNOSIS — R9089 Other abnormal findings on diagnostic imaging of central nervous system: Secondary | ICD-10-CM

## 2018-08-14 MED ORDER — GADOBENATE DIMEGLUMINE 529 MG/ML IV SOLN
15.0000 mL | Freq: Once | INTRAVENOUS | Status: AC | PRN
  Administered 2018-08-14: 15 mL via INTRAVENOUS

## 2018-08-15 ENCOUNTER — Telehealth: Payer: Self-pay | Admitting: Neurology

## 2018-08-15 NOTE — Telephone Encounter (Signed)
I called the patient. No change in the MRI from 8/18. The patient will call if new problems or concerns.    MRI brain 08/15/18:  IMPRESSION: Abnormal MRI scan of the brain showing persistent mild T2/flair hyperintensity in the posterior lower midbrain and upper pons which appears to be unchanged compared with previous MRI from 05/19/2017.  No enhancing lesions are noted.  There also appears to be stable changes of chronic paranasal sinusitis.

## 2018-08-22 ENCOUNTER — Encounter: Payer: Self-pay | Admitting: Internal Medicine

## 2018-08-27 ENCOUNTER — Encounter: Payer: Self-pay | Admitting: Internal Medicine

## 2018-09-04 NOTE — Telephone Encounter (Signed)
MyChart message sent to patient with billing's phone #.

## 2018-09-19 ENCOUNTER — Encounter: Payer: Self-pay | Admitting: Internal Medicine

## 2018-09-25 ENCOUNTER — Ambulatory Visit (INDEPENDENT_AMBULATORY_CARE_PROVIDER_SITE_OTHER): Payer: Medicare HMO | Admitting: Family

## 2018-09-25 ENCOUNTER — Encounter: Payer: Self-pay | Admitting: Family

## 2018-09-25 VITALS — BP 104/70 | HR 60 | Temp 98.2°F | Ht 67.0 in | Wt 162.4 lb

## 2018-09-25 DIAGNOSIS — H6122 Impacted cerumen, left ear: Secondary | ICD-10-CM | POA: Diagnosis not present

## 2018-09-25 MED ORDER — FLUTICASONE PROPIONATE 50 MCG/ACT NA SUSP: 2.0000 | Freq: Every day | NASAL | 6 refills | Status: DC

## 2018-09-25 NOTE — Progress Notes (Signed)
  Tanner DykesBenjamin C Papania is a 67 y.o. male with the following history as recorded in Epic[employer]:  Patient Active Problem List   Diagnosis Date Noted  . Hearing loss due to cerumen impaction, bilateral 08/19/2017  . Medicare annual wellness visit, subsequent 02/20/2017  . Routine adult health maintenance 02/20/2017  . Diplopia 12/28/2016  . Pancytopenia (HCC) 12/28/2016  . Glaucoma 11/09/2016  . Acute upper respiratory infection 11/09/2016    Current Outpatient Medications  Medication Sig Dispense Refill  . brimonidine-timolol (COMBIGAN) 0.2-0.5 % ophthalmic solution Place 1 drop into both eyes every 12 (twelve) hours.    . calcium-vitamin D (OSCAL WITH D) 500-200 MG-UNIT tablet Take 1 tablet by mouth daily.    Marland Kitchen. co-enzyme Q-10 30 MG capsule Take 30 mg by mouth daily.    . fluticasone (FLONASE) 50 MCG/ACT nasal spray Place 2 sprays into both nostrils daily. 16 g 6   No current facility-administered medications for this visit.     Allergies: Patient has no known allergies.  Past Medical History:  Diagnosis Date  . Diplopia   . Glaucoma   . Pancytopenia (HCC) 12/28/2016    Past Surgical History:  Procedure Laterality Date  . CATARACT EXTRACTION Bilateral    One eye laser removal, other removed about 15 yr ago  . HEMORROIDECTOMY      Family History  Problem Relation Age of Onset  . Breast cancer Mother   . Prostate cancer Father     Social History   Tobacco Use  . Smoking status: Never Smoker  . Smokeless tobacco: Never Used  Substance Use Topics  . Alcohol use: Yes    Comment: Occasional    Subjective:  Patient presents with concerns for ear wax/ needing lavage; typically has to have done yearly; feels like his hearing is down; no dizziness;  Had yearly Medicare Wellness exam in June 2019; due for yearly follow-up with his PCP;     Objective:  Vitals:   09/25/18 1342  BP: 104/70  Pulse: 60  Temp: 98.2 F (36.8 C)  TempSrc: Oral  SpO2: 99%  Weight: 162 lb 6.4 oz  (73.7 kg)  Height: 5\' 7"  (1.702 m)    General: Well developed, well nourished, in no acute distress  Skin : Warm and dry.  Head: Normocephalic and atraumatic  Eyes: Sclera and conjunctiva clear; pupils round and reactive to light; extraocular movements intact  Ears: External normal; canals clear; after lavage, tympanic membranes normal / mild congestion noted Oropharynx: Pink, supple. No suspicious lesions  Neck: Supple without thyromegaly, adenopathy  Lungs: Respirations unlabored; clear to auscultation bilaterally without wheeze, rales, rhonchi  Neurologic: Alert and oriented; speech intact; face symmetrical; moves all extremities well; CNII-XII intact without focal deficit   Assessment:  1. Impacted cerumen of left ear     Plan:  Ear lavage completed with no difficulty; trial of Flonase NS 2 sprays nostril qd;  Plan to schedule yearly follow-up with his PCP.   No follow-ups on file.  No orders of the defined types were placed in this encounter.   Requested Prescriptions   Signed Prescriptions Disp Refills  . fluticasone (FLONASE) 50 MCG/ACT nasal spray 16 g 6    Sig: Place 2 sprays into both nostrils daily.

## 2018-09-25 NOTE — Patient Instructions (Signed)
Schedule yearly follow up with Dr. Yetta BarreJones as discussed

## 2018-10-02 ENCOUNTER — Encounter: Payer: Self-pay | Admitting: Internal Medicine

## 2018-11-02 DIAGNOSIS — H401130 Primary open-angle glaucoma, bilateral, stage unspecified: Secondary | ICD-10-CM | POA: Diagnosis not present

## 2018-11-21 ENCOUNTER — Ambulatory Visit (INDEPENDENT_AMBULATORY_CARE_PROVIDER_SITE_OTHER): Payer: Medicare HMO | Admitting: Internal Medicine

## 2018-11-21 ENCOUNTER — Other Ambulatory Visit (INDEPENDENT_AMBULATORY_CARE_PROVIDER_SITE_OTHER): Payer: Medicare HMO

## 2018-11-21 ENCOUNTER — Encounter: Payer: Self-pay | Admitting: Internal Medicine

## 2018-11-21 DIAGNOSIS — E785 Hyperlipidemia, unspecified: Secondary | ICD-10-CM

## 2018-11-21 DIAGNOSIS — N400 Enlarged prostate without lower urinary tract symptoms: Secondary | ICD-10-CM

## 2018-11-21 DIAGNOSIS — Z0000 Encounter for general adult medical examination without abnormal findings: Secondary | ICD-10-CM

## 2018-11-21 DIAGNOSIS — D61818 Other pancytopenia: Secondary | ICD-10-CM

## 2018-11-21 LAB — CBC WITH DIFFERENTIAL/PLATELET
Basophils Absolute: 0 10*3/uL (ref 0.0–0.1)
Basophils Relative: 0.9 % (ref 0.0–3.0)
EOS PCT: 3.7 % (ref 0.0–5.0)
Eosinophils Absolute: 0.2 10*3/uL (ref 0.0–0.7)
HCT: 41.2 % (ref 39.0–52.0)
Hemoglobin: 13.5 g/dL (ref 13.0–17.0)
LYMPHS ABS: 1.8 10*3/uL (ref 0.7–4.0)
Lymphocytes Relative: 41 % (ref 12.0–46.0)
MCHC: 32.8 g/dL (ref 30.0–36.0)
MCV: 81.6 fl (ref 78.0–100.0)
Monocytes Absolute: 0.5 10*3/uL (ref 0.1–1.0)
Monocytes Relative: 11.4 % (ref 3.0–12.0)
Neutro Abs: 1.9 10*3/uL (ref 1.4–7.7)
Neutrophils Relative %: 43 % (ref 43.0–77.0)
Platelets: 157 10*3/uL (ref 150.0–400.0)
RBC: 5.05 Mil/uL (ref 4.22–5.81)
RDW: 13.8 % (ref 11.5–15.5)
WBC: 4.4 10*3/uL (ref 4.0–10.5)

## 2018-11-21 LAB — COMPREHENSIVE METABOLIC PANEL
ALT: 14 U/L (ref 0–53)
AST: 17 U/L (ref 0–37)
Albumin: 4.3 g/dL (ref 3.5–5.2)
Alkaline Phosphatase: 99 U/L (ref 39–117)
BUN: 22 mg/dL (ref 6–23)
CO2: 29 meq/L (ref 19–32)
Calcium: 9.2 mg/dL (ref 8.4–10.5)
Chloride: 104 mEq/L (ref 96–112)
Creatinine, Ser: 1.11 mg/dL (ref 0.40–1.50)
GFR: 79.46 mL/min (ref >60.00)
GLUCOSE: 84 mg/dL (ref 70–99)
Potassium: 4.3 mEq/L (ref 3.5–5.1)
SODIUM: 139 meq/L (ref 135–145)
Total Bilirubin: 0.9 mg/dL (ref 0.2–1.2)
Total Protein: 6.9 g/dL (ref 6.0–8.3)

## 2018-11-21 LAB — URINALYSIS, ROUTINE W REFLEX MICROSCOPIC
Bilirubin Urine: NEGATIVE
Ketones, ur: NEGATIVE
Leukocytes,Ua: NEGATIVE
Nitrite: NEGATIVE
Specific Gravity, Urine: 1.02 (ref 1.000–1.030)
Total Protein, Urine: NEGATIVE
Urine Glucose: NEGATIVE
Urobilinogen, UA: 1 (ref 0.0–1.0)
pH: 6 (ref 5.0–8.0)

## 2018-11-21 LAB — LIPID PANEL
Cholesterol: 185 mg/dL (ref 0–200)
HDL: 89.2 mg/dL (ref >39.00)
LDL Cholesterol: 84 mg/dL (ref 0–99)
NonHDL: 96.04
Total CHOL/HDL Ratio: 2
Triglycerides: 62 mg/dL (ref 0.0–149.0)
VLDL: 12.4 mg/dL (ref 0.0–40.0)

## 2018-11-21 LAB — TSH: TSH: 1.03 u[IU]/mL (ref 0.35–4.50)

## 2018-11-21 LAB — PSA: PSA: 0.46 ng/mL (ref 0.10–4.00)

## 2018-11-21 NOTE — Progress Notes (Signed)
Subjective:  Patient ID: Tanner Thompson, male    DOB: 03-Jul-1951  Age: 68 y.o. MRN: [id number]  CC: Annual Exam; Anemia; and Benign Prostatic Hypertrophy   HPI Tanner Thompson presents for f/up - He has a history of pancytopenia.  This was previously evaluated by hematology and apparently has resolved.  He also has a history of diplopia.  He was seen by a neurologist and he tells me that his work-up was unremarkable and his vision has been normal recently.  He feels well today and offers no complaints.  Outpatient Medications Prior to Visit  Medication Sig Dispense Refill  . brimonidine-timolol (COMBIGAN) 0.2-0.5 % ophthalmic solution Place 1 drop into both eyes every 12 (twelve) hours.    . cholecalciferol (VITAMIN D3) 25 MCG (1000 UT) tablet Take 1,000 Units by mouth daily.    . calcium-vitamin D (OSCAL WITH D) 500-200 MG-UNIT tablet Take 1 tablet by mouth daily.    Marland Kitchen. co-enzyme Q-10 30 MG capsule Take 30 mg by mouth daily.    . fluticasone (FLONASE) 50 MCG/ACT nasal spray Place 2 sprays into both nostrils daily. 16 g 6   No facility-administered medications prior to visit.     ROS Review of Systems  Constitutional: Negative.  Negative for appetite change, diaphoresis, fatigue and unexpected weight change.  HENT: Negative.   Eyes: Negative for visual disturbance.  Respiratory: Negative for cough, chest tightness, shortness of breath and wheezing.   Cardiovascular: Negative for chest pain, palpitations and leg swelling.  Gastrointestinal: Negative for abdominal pain, constipation, diarrhea, nausea and vomiting.  Genitourinary: Negative.  Negative for difficulty urinating, penile pain, penile swelling, scrotal swelling and testicular pain.  Musculoskeletal: Negative.  Negative for arthralgias and myalgias.  Skin: Negative.  Negative for color change.  Neurological: Negative.  Negative for dizziness, weakness and headaches.  Hematological: Negative for adenopathy. Does not  bruise/bleed easily.  Psychiatric/Behavioral: Negative.     Objective:  There were no vitals taken for this visit.  BP Readings from Last 3 Encounters:  09/25/18 104/70  04/06/18 (!) 106/54  01/01/18 108/60    Wt Readings from Last 3 Encounters:  09/25/18 162 lb 6.4 oz (73.7 kg)  04/06/18 161 lb (73 kg)  01/01/18 166 lb (75.3 kg)    Physical Exam Vitals signs reviewed.  Constitutional:      Appearance: He is not ill-appearing or diaphoretic.  HENT:     Nose: Nose normal. No congestion or rhinorrhea.     Mouth/Throat:     Mouth: Mucous membranes are moist.     Pharynx: Oropharynx is clear. No oropharyngeal exudate or posterior oropharyngeal erythema.  Eyes:     General: No scleral icterus.    Conjunctiva/sclera: Conjunctivae normal.  Neck:     Musculoskeletal: Normal range of motion and neck supple. No neck rigidity or muscular tenderness.     Vascular: No carotid bruit.  Cardiovascular:     Rate and Rhythm: Normal rate and regular rhythm.     Heart sounds: No murmur.  Pulmonary:     Effort: Pulmonary effort is normal. No respiratory distress.     Breath sounds: No wheezing, rhonchi or rales.  Abdominal:     General: Bowel sounds are normal. There is no distension.     Palpations: There is no hepatomegaly, splenomegaly or mass.     Tenderness: There is no abdominal tenderness.     Hernia: There is no hernia in the right inguinal area or left inguinal area.  Genitourinary:  Pubic Area: No rash.      Penis: Uncircumcised. No phimosis, paraphimosis, hypospadias, erythema, tenderness, discharge, swelling or lesions.      Scrotum/Testes: Normal.        Right: Mass not present.        Left: Mass not present.     Epididymis:     Right: Normal.     Prostate: Enlarged. Not tender and no nodules present (1+ smooth symm BPH).     Rectum: Normal. Guaiac result negative. No mass, tenderness, anal fissure, external hemorrhoid or internal hemorrhoid. Normal anal tone.    Musculoskeletal: Normal range of motion.        General: No swelling.     Right lower leg: No edema.     Left lower leg: No edema.  Lymphadenopathy:     Cervical: No cervical adenopathy.     Lower Body: No right inguinal adenopathy. No left inguinal adenopathy.  Skin:    General: Skin is warm and dry.     Coloration: Skin is not pale.     Findings: No erythema or rash.  Neurological:     General: No focal deficit present.     Mental Status: He is oriented to person, place, and time. Mental status is at baseline.  Psychiatric:        Mood and Affect: Mood normal.        Behavior: Behavior normal.        Thought Content: Thought content normal.        Judgment: Judgment normal.     Lab Results  Component Value Date   WBC 4.4 11/21/2018   HGB 13.5 11/21/2018   HCT 41.2 11/21/2018   PLT 157.0 11/21/2018   GLUCOSE 84 11/21/2018   CHOL 185 11/21/2018   TRIG 62.0 11/21/2018   HDL 89.20 11/21/2018   LDLCALC 84 11/21/2018   ALT 14 11/21/2018   AST 17 11/21/2018   NA 139 11/21/2018   K 4.3 11/21/2018   CL 104 11/21/2018   CREATININE 1.11 11/21/2018   BUN 22 11/21/2018   CO2 29 11/21/2018   TSH 1.03 11/21/2018   PSA 0.46 11/21/2018    Mr Brain W Wo Contrast  Result Date: 08/15/2018  [facility] [address], [address] [place], [place] [address] [phone] NEUROIMAGING REPORT STUDY DATE: 08/14/2018 PATIENT NAME: Tanner Thompson DOB: 07/13/1951 MRN: [id number] ORDERING CLINICIAN:Dr Willis CLINICAL HISTORY:  67 year patient with abnormal MRI COMPARISON FILMS: MRI Brain wo 05/19/2017 EXAM: MRI Brain w/wo TECHNIQUE: MRI of the brain with and without contrast was obtained utilizing 5 mm axial slices with T1, T2, T2 flair, T2 star gradient echo and diffusion weighted views.  T1 sagittal, T2 coronal and postcontrast views in the axial and coronal plane were obtained. CONTRAST:15 ml iv multihance IMAGING SITE: [facility] FINDINGS: The brain parenchyma shows  persistent mild hyperintensity on T2-weighted and FLAIR images within the posterior lower midbrain and upper pons. When compared to the MRI dated 05/19/17, edges are unchanged in appearance.  No other structural lesion, tumor or infarcts are noted.No abnormal lesions are seen on diffusion-weighted views to suggest acute ischemia. The cortical sulci, fissures and cisterns are normal in size and appearance. Lateral, third and fourth ventricle are normal in size and appearance. No extra-axial fluid collections are seen. No evidence of mass effect or midline shift.  No abnormal lesions are seen on post contrast views.  On sagittal views the posterior fossa, pituitary gland and corpus callosum are unremarkable.  No evidence of intracranial hemorrhage on gradient-echo views. The orbits and their contents,  and calvarium are unremarkable.  Intracranial flow voids are present.  The paranasal sinuses show chronic inflammatory changes.   Abnormal MRI scan of the brain showing persistent mild T2/flair hyperintensity in the posterior lower midbrain and upper pons which appears to be unchanged compared with previous MRI from 05/19/2017.  No enhancing lesions are noted.  There also appears to be stable changes of chronic paranasal sinusitis. INTERPRETING PHYSICIAN: Delia HeadyPRAMOD SETHI, MD Certified in  Neuroimaging by American Society of Neuroimaging and [employer]    Assessment & Plan:   Tanner Thompson was seen today for annual exam, anemia and benign prostatic hypertrophy.  Diagnoses and all orders for this visit:  Routine adult health maintenance  Pancytopenia (HCC)- His cell lines are all normal now. -     CBC with Differential/Platelet; Future  Benign prostatic hyperplasia without lower urinary tract symptoms- He has a low PSA which is reassuring that he does not have prostate cancer.  He has no symptoms that need to be treated. -     PSA; Future -     Urinalysis, Routine w reflex  microscopic; Future  Hyperlipidemia LDL goal <130- He has a low ASCVD risk score so I do not recommend a statin for CV risk reduction. -     Lipid panel; Future -     Comprehensive metabolic panel; Future -     TSH; Future   I have discontinued Tanner Thompson C. Corallo "Ben"'s calcium-vitamin D, co-enzyme Q-10, and fluticasone. I am also having him maintain his brimonidine-timolol and cholecalciferol.  No orders of the defined types were placed in this encounter.    Follow-up: Return if symptoms worsen or fail to improve.  Sanda Lingerhomas Lemuel Boodram, MD

## 2018-11-21 NOTE — Patient Instructions (Signed)

## 2018-11-22 ENCOUNTER — Encounter: Payer: Self-pay | Admitting: Internal Medicine

## 2018-11-22 DIAGNOSIS — N400 Enlarged prostate without lower urinary tract symptoms: Secondary | ICD-10-CM | POA: Insufficient documentation

## 2018-11-22 DIAGNOSIS — E785 Hyperlipidemia, unspecified: Secondary | ICD-10-CM | POA: Insufficient documentation

## 2019-03-25 ENCOUNTER — Encounter: Payer: Self-pay | Admitting: Internal Medicine

## 2019-03-26 DIAGNOSIS — M65312 Trigger thumb, left thumb: Secondary | ICD-10-CM | POA: Diagnosis not present

## 2019-04-04 ENCOUNTER — Encounter: Payer: Self-pay | Admitting: Internal Medicine

## 2019-04-04 ENCOUNTER — Encounter: Payer: Self-pay | Admitting: Family

## 2019-04-08 ENCOUNTER — Encounter: Payer: Self-pay | Admitting: Internal Medicine

## 2019-04-09 ENCOUNTER — Ambulatory Visit (INDEPENDENT_AMBULATORY_CARE_PROVIDER_SITE_OTHER): Payer: Medicare HMO | Admitting: *Deleted

## 2019-04-09 ENCOUNTER — Ambulatory Visit: Payer: Medicare HMO

## 2019-04-09 DIAGNOSIS — Z0000 Encounter for general adult medical examination without abnormal findings: Secondary | ICD-10-CM

## 2019-04-09 NOTE — Progress Notes (Addendum)
Subjective:   Tanner DykesBenjamin C Thompson is a 68 y.o. male who presents for Medicare Annual/Subsequent preventive examination. I connected with patient by a telephone and verified that I am speaking with the correct person using two identifiers. Patient stated full name and DOB. Patient gave permission to continue with telephonic visit. Patient's location was at home and Nurse's location was at [facility] office.   Review of Systems:   Cardiac Risk Factors include: advanced age (>3555men, 50>65 women);dyslipidemia Sleep patterns: feels rested on waking, gets up 1-2 times nightly to void and sleeps 6-7 hours nightly.    Home Safety/Smoke Alarms: Feels safe in home. Smoke alarms in place.  Living environment; residence and Firearm Safety: 1-story house/ trailer. Lives with wife, no needs for DME, good support system Seat Belt Safety/Bike Helmet: Wears seat belt.   PSA-  Lab Results  Component Value Date   PSA 0.46 11/21/2018   PSA 0.44 02/20/2017       Objective:    Vitals: There were no vitals taken for this visit.  There is no height or weight on file to calculate BMI.  Advanced Directives 04/09/2019 04/06/2018 12/23/2016  Does Patient Have a Medical Advance Directive? No No No  Would patient like information on creating a medical advance directive? Yes (ED - Information included in AVS) Yes (ED - Information included in AVS) Yes (ED - Information included in AVS)    Tobacco Social History   Tobacco Use  Smoking Status Never Smoker  Smokeless Tobacco Never Used     Counseling given: Not Answered   Past Medical History:  Diagnosis Date  . Diplopia   . Glaucoma   . Pancytopenia (HCC) 12/28/2016   Past Surgical History:  Procedure Laterality Date  . CATARACT EXTRACTION Bilateral    One eye laser removal, other removed about 15 yr ago  . HEMORROIDECTOMY     Family History  Problem Relation Age of Onset  . Breast cancer Mother   . Prostate cancer Father    Social History    Socioeconomic History  . Marital status: Married    Spouse name: Steward DroneBrenda  . Number of children: 2  . Years of education: 4612  . Highest education level: Not on file  Occupational History  . Occupation: Retired - MidwifeBus Driver   Social Needs  . Financial resource strain: Not hard at all  . Food insecurity    Worry: Never true    Inability: Never true  . Transportation needs    Medical: No    Non-medical: No  Tobacco Use  . Smoking status: Never Smoker  . Smokeless tobacco: Never Used  Substance and Sexual Activity  . Alcohol use: Yes    Comment: Occasional  . Drug use: No  . Sexual activity: Yes  Lifestyle  . Physical activity    Days per week: 5 days    Minutes per session: 50 min  . Stress: Not at all  Relationships  . Social connections    Talks on phone: More than three times a week    Gets together: More than three times a week    Attends religious service: More than 4 times per year    Active member of club or organization: Yes    Attends meetings of clubs or organizations: Not on file    Relationship status: Married  Other Topics Concern  . Not on file  Social History Narrative   Lives with spouse   Fun: Reading, art, physical activity  Outpatient Encounter Medications as of 04/09/2019  Medication Sig  . brimonidine-timolol (COMBIGAN) 0.2-0.5 % ophthalmic solution Place 1 drop into both eyes every 12 (twelve) hours.  . cholecalciferol (VITAMIN D3) 25 MCG (1000 UT) tablet Take 1,000 Units by mouth daily.   No facility-administered encounter medications on file as of 04/09/2019.     Activities of Daily Living In your present state of health, do you have any difficulty performing the following activities: 04/09/2019  Hearing? N  Vision? N  Difficulty concentrating or making decisions? N  Walking or climbing stairs? N  Dressing or bathing? N  Doing errands, shopping? N  Preparing Food and eating ? N  Using the Toilet? N  In the past six months, have you  accidently leaked urine? N  Do you have problems with loss of bowel control? N  Managing your Medications? N  Managing your Finances? N  Housekeeping or managing your Housekeeping? N  Some recent data might be hidden    Patient Care Team: Etta GrandchildJones, Thomas L, MD as PCP - General (Internal Medicine) [place] SpanielWillis, Charles K, MD as Consulting Physician (Neurology) Bradly Bienenstockrtmann, Fred, MD as Consulting Physician (Orthopedic Surgery) Genia DelMincey, Daisy BlossomAndrew Julian, MD as Consulting Physician (Ophthalmology)   Assessment:   This is a routine wellness examination for Tanner SalinaBenjamin.  Exercise Activities and Dietary recommendations Current Exercise Habits: Home exercise routine;Structured exercise class, Type of exercise: walking(Zumba), Time (Minutes): 50, Frequency (Times/Week): 5, Weekly Exercise (Minutes/Week): 250, Intensity: Mild, Exercise limited by: orthopedic condition(s)  Diet (meal preparation, eat out, water intake, caffeinated beverages, dairy products, fruits and vegetables): in general, a "healthy" diet  , well balanced. eats a variety of fruits and vegetables daily, limits salt, fat/cholesterol, sugar,carbohydrates,caffeine, drinks 6-8 glasses of water daily.   Goals    . Patient Stated     Continue to volunteer in the community, assist and support my family, enjoy life and family. Continue to exercise and eat healthy.       Fall Risk Fall Risk  04/09/2019 11/21/2018 04/06/2018 02/20/2017 11/09/2016  Falls in the past year? 0 0 No No No  Number falls in past yr: 0 0 - - -  Injury with Fall? - 0 - - -  Follow up - Falls evaluation completed - - -    Depression Screen PHQ 2/9 Scores 04/09/2019 11/21/2018 04/06/2018 02/20/2017  PHQ - 2 Score 0 4 0 0  PHQ- 9 Score - 10 - -    Cognitive Function       Ad8 score reviewed for issues:  Issues making decisions: no  Less interest in hobbies / activities: no  Repeats questions, stories (family complaining): no  Trouble using ordinary gadgets (microwave,  computer, phone):no  Forgets the month or year: no  Mismanaging finances: no  Remembering appts: no  Daily problems with thinking and/or memory: no Ad8 score is= 0  Immunization History  Administered Date(s) Administered  . Influenza, High Dose Seasonal PF 07/19/2018  . Pneumococcal Conjugate-13 02/20/2017  . Pneumococcal Polysaccharide-23 08/13/2015  . Tdap 08/17/2017  . Zoster Recombinat (Shingrix) 06/05/2017, 09/04/2017   Screening Tests Health Maintenance  Topic Date Due  . INFLUENZA VACCINE  05/11/2019  . COLONOSCOPY  03/15/2023  . TETANUS/TDAP  08/18/2027  . Hepatitis C Screening  Completed  . PNA vac Low Risk Adult  Completed       Plan:    Reviewed health maintenance screenings with patient today and relevant education, vaccines, and/or referrals were provided.   Continue to eat heart healthy diet (  full of fruits, vegetables, whole grains, lean protein, water--limit salt, fat, and sugar intake) and increase physical activity as tolerated.  Continue doing brain stimulating activities (puzzles, reading, adult coloring books, staying active) to keep memory sharp.   I have personally reviewed and noted the following in the patient's chart:   . Medical and social history . Use of alcohol, tobacco or illicit drugs  . Current medications and supplements . Functional ability and status . Nutritional status . Physical activity . Advanced directives . List of other physicians . Screenings to include cognitive, depression, and falls . Referrals and appointments  In addition, I have reviewed and discussed with patient certain preventive protocols, quality metrics, and best practice recommendations. A written personalized care plan for preventive services as well as general preventive health recommendations were provided to patient.     Michiel Cowboy, RN  04/09/2019  Medical screening examination/treatment/procedure(s) were performed by non-physician practitioner and  as supervising physician I was immediately available for consultation/collaboration. I agree with above. Scarlette Calico, MD

## 2019-05-03 DIAGNOSIS — H401132 Primary open-angle glaucoma, bilateral, moderate stage: Secondary | ICD-10-CM | POA: Diagnosis not present

## 2019-05-07 ENCOUNTER — Other Ambulatory Visit: Payer: Self-pay

## 2019-05-07 DIAGNOSIS — Z20822 Contact with and (suspected) exposure to covid-19: Secondary | ICD-10-CM

## 2019-05-07 DIAGNOSIS — R6889 Other general symptoms and signs: Secondary | ICD-10-CM | POA: Diagnosis not present

## 2019-05-09 LAB — NOVEL CORONAVIRUS, NAA: SARS-CoV-2, NAA: NOT DETECTED

## 2019-06-07 ENCOUNTER — Encounter: Payer: Self-pay | Admitting: Internal Medicine

## 2019-06-08 ENCOUNTER — Ambulatory Visit (INDEPENDENT_AMBULATORY_CARE_PROVIDER_SITE_OTHER): Payer: Medicare HMO

## 2019-06-08 ENCOUNTER — Other Ambulatory Visit: Payer: Self-pay

## 2019-06-08 DIAGNOSIS — Z23 Encounter for immunization: Secondary | ICD-10-CM | POA: Diagnosis not present

## 2019-06-20 DIAGNOSIS — M65312 Trigger thumb, left thumb: Secondary | ICD-10-CM | POA: Diagnosis not present

## 2019-08-07 ENCOUNTER — Encounter: Payer: Self-pay | Admitting: Internal Medicine

## 2019-08-15 ENCOUNTER — Other Ambulatory Visit: Payer: Self-pay

## 2019-08-15 ENCOUNTER — Ambulatory Visit (INDEPENDENT_AMBULATORY_CARE_PROVIDER_SITE_OTHER): Payer: Medicare HMO | Admitting: Internal Medicine

## 2019-08-15 ENCOUNTER — Encounter: Payer: Self-pay | Admitting: Internal Medicine

## 2019-08-15 VITALS — BP 120/70 | HR 53 | Temp 98.2°F | Resp 16 | Ht 67.0 in | Wt 169.8 lb

## 2019-08-15 DIAGNOSIS — R319 Hematuria, unspecified: Secondary | ICD-10-CM

## 2019-08-15 LAB — POC URINALSYSI DIPSTICK (AUTOMATED)
Bilirubin, UA: NEGATIVE
Blood, UA: NEGATIVE
Glucose, UA: NEGATIVE
Ketones, UA: NEGATIVE
Leukocytes, UA: NEGATIVE
Nitrite, UA: NEGATIVE
Protein, UA: NEGATIVE
Spec Grav, UA: 1.015 (ref 1.010–1.025)
Urobilinogen, UA: 0.2 E.U./dL
pH, UA: 6 (ref 5.0–8.0)

## 2019-08-15 NOTE — Patient Instructions (Signed)
Hematuria, Adult Hematuria is blood in the urine. Blood may be visible in the urine, or it may be identified with a test. This condition can be caused by infections of the bladder, urethra, kidney, or prostate. Other possible causes include:  Kidney stones.  Cancer of the urinary tract.  Too much calcium in the urine.  Conditions that are passed from parent to child (inherited conditions).  Exercise that requires a lot of energy. Infections can usually be treated with medicine, and a kidney stone usually will pass through your urine. If neither of these is the cause of your hematuria, more tests may be needed to identify the cause of your symptoms. It is very important to tell your health care provider about any blood in your urine, even if it is painless or the blood stops without treatment. Blood in the urine, when it happens and then stops and then happens again, can be a symptom of a very serious condition, including cancer. There is no pain in the initial stages of many urinary cancers. Follow these instructions at home: Medicines  Take over-the-counter and prescription medicines only as told by your health care provider.  If you were prescribed an antibiotic medicine, take it as told by your health care provider. Do not stop taking the antibiotic even if you start to feel better. Eating and drinking  Drink enough fluid to keep your urine clear or pale yellow. It is recommended that you drink 3-4 quarts (2.8-3.8 L) a day. If you have been diagnosed with an infection, it is recommended that you drink cranberry juice in addition to large amounts of water.  Avoid caffeine, tea, and carbonated beverages. These tend to irritate the bladder.  Avoid alcohol because it may irritate the prostate (men). General instructions  If you have been diagnosed with a kidney stone, follow your health care provider's instructions about straining your urine to catch the stone.  Empty your bladder  often. Avoid holding urine for long periods of time.  If you are male: ? After a bowel movement, wipe from front to back and use each piece of toilet paper only once. ? Empty your bladder before and after sex.  Pay attention to any changes in your symptoms. Tell your health care provider about any changes or any new symptoms.  It is your responsibility to get your test results. Ask your health care provider, or the department performing the test, when your results will be ready.  Keep all follow-up visits as told by your health care provider. This is important. Contact a health care provider if:  You develop back pain.  You have a fever.  You have nausea or vomiting.  Your symptoms do not improve after 3 days.  Your symptoms get worse. Get help right away if:  You develop severe vomiting and are unable take medicine without vomiting.  You develop severe pain in your back or abdomen even though you are taking medicine.  You pass a large amount of blood in your urine.  You pass blood clots in your urine.  You feel very weak or like you might faint.  You faint. Summary  Hematuria is blood in the urine. It has many possible causes.  It is very important that you tell your health care provider about any blood in your urine, even if it is painless or the blood stops without treatment.  Take over-the-counter and prescription medicines only as told by your health care provider.  Drink enough fluid to keep   your urine clear or pale yellow. This information is not intended to replace advice given to you by your health care provider. Make sure you discuss any questions you have with your health care provider. Document Released: 09/26/2005 Document Revised: 09/08/2017 Document Reviewed: 10/29/2016 Elsevier Patient Education  2020 Elsevier Inc.  

## 2019-08-15 NOTE — Progress Notes (Signed)
Subjective:  Patient ID: Tanner Thompson, male    DOB: August 21, 1951  Age: 68 y.o. 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  He does not have a copy of those results today.  He has not recently noticed any hematuria and he denies abdominal pain, flank pain, nausea, vomiting, fever, or chills.  Outpatient Medications Prior to Visit  Medication Sig Dispense Refill  . BLACK CURRANT SEED OIL PO Take by mouth.    . brimonidine-timolol (COMBIGAN) 0.2-0.5 % ophthalmic solution Place 1 drop into both eyes every 12 (twelve) hours.    . cholecalciferol (VITAMIN D3) 25 MCG (1000 UT) tablet Take 1,000 Units by mouth daily.    . Coenzyme Q10 (CO Q 10 PO) Take 1 tablet by mouth as needed.    . Ferrous Sulfate (IRON PO) Take 1 tablet by mouth as needed.    Marland Kitchen MAGNESIUM PO Take 1 tablet by mouth as needed.     . NON FORMULARY Bilbery Extract    . NON FORMULARY Boswella Complex    . Omega 3-6-9 Fatty Acids (OMEGA-3-6-9 PO) Take 1 tablet by mouth daily.     No facility-administered medications prior to visit.     ROS Review of Systems  Constitutional: Negative for diaphoresis and fatigue.  HENT: Negative.   Eyes: Negative.   Respiratory: Negative for cough, chest tightness, shortness of breath and wheezing.   Cardiovascular: Negative.  Negative for chest pain and palpitations.  Gastrointestinal: Negative for abdominal pain, constipation, diarrhea, nausea and vomiting.  Endocrine: Negative.   Genitourinary: Negative for difficulty urinating, discharge, dysuria, flank pain, hematuria, scrotal swelling, testicular pain and urgency.  Musculoskeletal: Negative.   Skin: Negative.  Negative for color change and pallor.  Neurological: Negative.  Negative for dizziness, weakness and headaches.  Hematological: Negative for adenopathy. Does not bruise/bleed  easily.  Psychiatric/Behavioral: Negative.     Objective:  BP 120/70 (BP Location: Left Arm, Patient Position: Sitting, Cuff Size: Normal)   Pulse (!) 53   Temp 98.2 F (36.8 C) (Oral)   Resp 16   Ht 5\' 7"  (1.702 m)   Wt 169 lb 12 oz (77 kg)   SpO2 99%   BMI 26.59 kg/m   BP Readings from Last 3 Encounters:  08/15/19 120/70  09/25/18 104/70  04/06/18 (!) 106/54    Wt Readings from Last 3 Encounters:  08/15/19 169 lb 12 oz (77 kg)  09/25/18 162 lb 6.4 oz (73.7 kg)  04/06/18 161 lb (73 kg)    Physical Exam Vitals signs reviewed.  Constitutional:      Appearance: Normal appearance.  HENT:     Nose: Nose normal.     Mouth/Throat:     Mouth: Mucous membranes are moist.  Eyes:     General: No scleral icterus.    Conjunctiva/sclera: Conjunctivae normal.  Neck:     Musculoskeletal: Neck supple. No muscular tenderness.  Cardiovascular:     Rate and Rhythm: Normal rate and regular rhythm.     Heart sounds: No murmur.  Pulmonary:     Effort: Pulmonary effort is normal.     Breath sounds: No stridor. No wheezing, rhonchi or rales.  Abdominal:     General: Abdomen is flat. Bowel sounds are normal. There is no distension.     Palpations: Abdomen is  soft. There is no hepatomegaly, splenomegaly or mass.     Tenderness: There is no abdominal tenderness.  Musculoskeletal: Normal range of motion.     Right lower leg: No edema.     Left lower leg: No edema.  Lymphadenopathy:     Cervical: No cervical adenopathy.  Skin:    General: Skin is warm and dry.     Coloration: Skin is not pale.  Neurological:     General: No focal deficit present.     Mental Status: He is alert.     Lab Results  Component Value Date   WBC 4.4 11/21/2018   HGB 13.5 11/21/2018   HCT 41.2 11/21/2018   PLT 157.0 11/21/2018   GLUCOSE 84 11/21/2018   CHOL 185 11/21/2018   TRIG 62.0 11/21/2018   HDL 89.20 11/21/2018   LDLCALC 84 11/21/2018   ALT 14 11/21/2018   AST 17 11/21/2018   NA 139  11/21/2018   K 4.3 11/21/2018   CL 104 11/21/2018   CREATININE 1.11 11/21/2018   BUN 22 11/21/2018   CO2 29 11/21/2018   TSH 1.03 11/21/2018   PSA 0.46 11/21/2018    Mr Brain W Wo Contrast  Result Date: 08/15/2018  [facility] [address], [address] [place], [place] [address] [phone] NEUROIMAGING REPORT STUDY DATE: 08/14/2018 PATIENT NAME: Tanner Thompson DOB: January 03, 1951 MRN: [id number] ORDERING CLINICIAN:Dr Willis CLINICAL HISTORY:  67 year patient with abnormal MRI COMPARISON FILMS: MRI Brain wo 05/19/2017 EXAM: MRI Brain w/wo TECHNIQUE: MRI of the brain with and without contrast was obtained utilizing 5 mm axial slices with T1, T2, T2 flair, T2 star gradient echo and diffusion weighted views.  T1 sagittal, T2 coronal and postcontrast views in the axial and coronal plane were obtained. CONTRAST:15 ml iv multihance IMAGING SITE: [facility] FINDINGS: The brain parenchyma shows persistent mild hyperintensity on T2-weighted and FLAIR images within the posterior lower midbrain and upper pons. When compared to the MRI dated 05/19/17, edges are unchanged in appearance.  No other structural lesion, tumor or infarcts are noted.No abnormal lesions are seen on diffusion-weighted views to suggest acute ischemia. The cortical sulci, fissures and cisterns are normal in size and appearance. Lateral, third and fourth ventricle are normal in size and appearance. No extra-axial fluid collections are seen. No evidence of mass effect or midline shift.  No abnormal lesions are seen on post contrast views.  On sagittal views the posterior fossa, pituitary gland and corpus callosum are unremarkable. No evidence of intracranial hemorrhage on gradient-echo views. The orbits and their contents,  and calvarium are unremarkable.  Intracranial flow voids are present.  The paranasal sinuses show chronic inflammatory changes.   Abnormal MRI scan of the brain showing persistent mild T2/flair  hyperintensity in the posterior lower midbrain and upper pons which appears to be unchanged compared with previous MRI from 05/19/2017.  No enhancing lesions are noted.  There also appears to be stable changes of chronic paranasal sinusitis. INTERPRETING PHYSICIAN: Delia Heady, MD Certified in  Neuroimaging by American Society of Neuroimaging and [employer]ities    Assessment & Plan:   Tanner Thompson was seen today for hematuria.  Diagnoses and all orders for this visit:  Hematuria, unspecified type- He reports a recent history of asymptomatic hematuria on a urinalysis done elsewhere.  I do not have a copy of that urinalysis.  He has no suspicious symptoms.  His dipstick UA today is negative for red blood cells or any other  abnormal elements.  I have asked him to get a copy of the recent urinalysis forwarded to me and at that time I will make further recommendations.  In the meantime he will let me know if he develops any new symptoms. -     POCT Urinalysis Dipstick (Automated)   I am having Tanner SalinaBenjamin C. Raetz [employer]"Ben" maintain his brimonidine-timolol, cholecalciferol, BLACK CURRANT SEED OIL PO, NON FORMULARY, Omega 3-6-9 Fatty Acids (OMEGA-3-6-9 PO), NON FORMULARY, MAGNESIUM PO, Ferrous Sulfate (IRON PO), and Coenzyme Q10 (CO Q 10 PO).  No orders of the defined types were placed in this encounter.    Follow-up: Return in about 3 months (around 11/15/2019).  Sanda Lingerhomas Aki Abalos, MD

## 2019-08-19 ENCOUNTER — Encounter: Payer: Self-pay | Admitting: Internal Medicine

## 2019-08-29 ENCOUNTER — Other Ambulatory Visit: Payer: Self-pay

## 2019-08-29 DIAGNOSIS — Z20822 Contact with and (suspected) exposure to covid-19: Secondary | ICD-10-CM

## 2019-09-01 LAB — NOVEL CORONAVIRUS, NAA: SARS-CoV-2, NAA: NOT DETECTED

## 2019-10-24 ENCOUNTER — Encounter: Payer: Self-pay | Admitting: Internal Medicine

## 2019-10-29 ENCOUNTER — Encounter: Payer: Self-pay | Admitting: Internal Medicine

## 2019-11-05 DIAGNOSIS — H401131 Primary open-angle glaucoma, bilateral, mild stage: Secondary | ICD-10-CM | POA: Diagnosis not present

## 2020-01-08 ENCOUNTER — Encounter: Payer: Self-pay | Admitting: Internal Medicine

## 2020-02-08 DIAGNOSIS — Z20822 Contact with and (suspected) exposure to covid-19: Secondary | ICD-10-CM | POA: Diagnosis not present

## 2020-02-08 DIAGNOSIS — Z20828 Contact with and (suspected) exposure to other viral communicable diseases: Secondary | ICD-10-CM | POA: Diagnosis not present

## 2020-02-08 DIAGNOSIS — Z03818 Encounter for observation for suspected exposure to other biological agents ruled out: Secondary | ICD-10-CM | POA: Diagnosis not present

## 2020-04-06 ENCOUNTER — Encounter: Payer: Self-pay | Admitting: Internal Medicine

## 2020-04-07 ENCOUNTER — Ambulatory Visit (INDEPENDENT_AMBULATORY_CARE_PROVIDER_SITE_OTHER): Payer: Medicare HMO | Admitting: Internal Medicine

## 2020-04-07 ENCOUNTER — Other Ambulatory Visit: Payer: Self-pay | Admitting: Internal Medicine

## 2020-04-07 ENCOUNTER — Other Ambulatory Visit: Payer: Self-pay

## 2020-04-07 ENCOUNTER — Encounter: Payer: Self-pay | Admitting: Internal Medicine

## 2020-04-07 VITALS — BP 106/68 | HR 55 | Temp 97.8°F | Ht 67.0 in | Wt 163.2 lb

## 2020-04-07 DIAGNOSIS — L02411 Cutaneous abscess of right axilla: Secondary | ICD-10-CM | POA: Diagnosis not present

## 2020-04-07 DIAGNOSIS — N400 Enlarged prostate without lower urinary tract symptoms: Secondary | ICD-10-CM

## 2020-04-07 DIAGNOSIS — R2231 Localized swelling, mass and lump, right upper limb: Secondary | ICD-10-CM | POA: Diagnosis not present

## 2020-04-07 DIAGNOSIS — L720 Epidermal cyst: Secondary | ICD-10-CM | POA: Diagnosis not present

## 2020-04-07 DIAGNOSIS — Z0000 Encounter for general adult medical examination without abnormal findings: Secondary | ICD-10-CM

## 2020-04-07 DIAGNOSIS — E785 Hyperlipidemia, unspecified: Secondary | ICD-10-CM

## 2020-04-07 NOTE — Progress Notes (Signed)
Subjective:  Patient ID: Tanner Thompson, male    DOB: 1950-11-10  Age: 69 y.o. MRN: [id number]  CC: Abscess  This visit occurred during the SARS-CoV-2 public health emergency.  Safety protocols were in place, including screening questions prior to the visit, additional usage of staff PPE, and extensive cleaning of exam room while observing appropriate contact time as indicated for disinfecting solutions.    HPI Tanner Thompson presents for concerns about a lump in his right axilla -he noticed it about a month ago.  He feels like it is enlarging and is sometimes uncomfortable.  Outpatient Medications Prior to Visit  Medication Sig Dispense Refill  . BLACK CURRANT SEED OIL PO Take by mouth.    . brimonidine-timolol (COMBIGAN) 0.2-0.5 % ophthalmic solution Place 1 drop into both eyes every 12 (twelve) hours.    . cholecalciferol (VITAMIN D3) 25 MCG (1000 UT) tablet Take 1,000 Units by mouth daily.    . Coenzyme Q10 (CO Q 10 PO) Take 1 tablet by mouth as needed.    . Ferrous Sulfate (IRON PO) Take 1 tablet by mouth as needed.    Marland Kitchen MAGNESIUM PO Take 1 tablet by mouth as needed.     . NON FORMULARY Bilbery Extract    . NON FORMULARY Boswella Complex    . Omega 3-6-9 Fatty Acids (OMEGA-3-6-9 PO) Take 1 tablet by mouth daily.     No facility-administered medications prior to visit.    ROS Review of Systems  Constitutional: Negative.  Negative for chills, fatigue and fever.  HENT: Negative.  Negative for sore throat and trouble swallowing.   Eyes: Negative.   Respiratory: Negative.  Negative for cough, chest tightness, shortness of breath and wheezing.   Cardiovascular: Negative for chest pain, palpitations and leg swelling.  Gastrointestinal: Negative for abdominal pain, constipation, diarrhea, nausea and vomiting.  Genitourinary: Negative.  Negative for difficulty urinating, dysuria and hematuria.  Musculoskeletal: Negative for arthralgias and myalgias.  Neurological: Negative.   Negative for dizziness.  Hematological: Negative for adenopathy. Does not bruise/bleed easily.  Psychiatric/Behavioral: Negative.   All other systems reviewed and are negative.   Objective:  BP 106/68 (BP Location: Left Arm, Patient Position: Sitting, Cuff Size: Normal)   Pulse (!) 55   Temp 97.8 F (36.6 C) (Oral)   Ht 5\' 7"  (1.702 m)   Wt 163 lb 4 oz (74 kg)   SpO2 99%   BMI 25.57 kg/m   BP Readings from Last 3 Encounters:  04/09/20 110/62  04/07/20 106/68  08/15/19 120/70    Wt Readings from Last 3 Encounters:  04/09/20 162 lb (73.5 kg)  04/07/20 163 lb 4 oz (74 kg)  08/15/19 169 lb 12 oz (77 kg)    Physical Exam Vitals reviewed.  Skin:    Comments: Right mid axillary region there is a 2 cm subcutaneous mass.  It is firm, freely mobile, and slightly tender.  The overlying skin is mildly erythematous.  There is no exudate, vesicles, or pustules.     Lab Results  Component Value Date   WBC 4.4 11/21/2018   HGB 13.5 11/21/2018   HCT 41.2 11/21/2018   PLT 157.0 11/21/2018   GLUCOSE 84 11/21/2018   CHOL 185 11/21/2018   TRIG 62.0 11/21/2018   HDL 89.20 11/21/2018   LDLCALC 84 11/21/2018   ALT 14 11/21/2018   AST 17 11/21/2018   NA 139 11/21/2018   K 4.3 11/21/2018   CL 104 11/21/2018   CREATININE 1.11 11/21/2018  BUN 22 11/21/2018   CO2 29 11/21/2018   TSH 1.03 11/21/2018   PSA 0.46 11/21/2018    MR BRAIN W WO CONTRAST  Result Date: 08/15/2018  [facility] [address] [place], [place] [address] [phone] NEUROIMAGING REPORT STUDY DATE: 08/14/2018 PATIENT NAME: ANTWUAN ECKLEY DOB: Oct 07, 1951 MRN: [id number] ORDERING CLINICIAN:Dr Anne Hahn CLINICAL HISTORY:  67 year patient with abnormal MRI COMPARISON FILMS: MRI Brain wo 05/19/2017 EXAM: MRI Brain w/wo TECHNIQUE: MRI of the brain with and without contrast was obtained utilizing 5 mm axial slices with T1, T2, T2 flair, T2 star gradient echo and diffusion weighted views.  T1  sagittal, T2 coronal and postcontrast views in the axial and coronal plane were obtained. CONTRAST:15 ml iv multihance IMAGING SITE: [facility] FINDINGS: The brain parenchyma shows persistent mild hyperintensity on T2-weighted and FLAIR images within the posterior lower midbrain and upper pons. When compared to the MRI dated 05/19/17, edges are unchanged in appearance.  No other structural lesion, tumor or infarcts are noted.No abnormal lesions are seen on diffusion-weighted views to suggest acute ischemia. The cortical sulci, fissures and cisterns are normal in size and appearance. Lateral, third and fourth ventricle are normal in size and appearance. No extra-axial fluid collections are seen. No evidence of mass effect or midline shift.  No abnormal lesions are seen on post contrast views.  On sagittal views the posterior fossa, pituitary gland and corpus callosum are unremarkable. No evidence of intracranial hemorrhage on gradient-echo views. The orbits and their contents,  and calvarium are unremarkable.  Intracranial flow voids are present.  The paranasal sinuses show chronic inflammatory changes.   Abnormal MRI scan of the brain showing persistent mild T2/flair hyperintensity in the posterior lower midbrain and upper pons which appears to be unchanged compared with previous MRI from 05/19/2017.  No enhancing lesions are noted.  There also appears to be stable changes of chronic paranasal sinusitis. INTERPRETING PHYSICIAN: Delia Heady, MD Certified in  Neuroimaging by American Society of Neuroimaging and [employer]ities   After informed verbal consent was obtained. Using Betadine for cleansing and 2% Lidocaine with epinephrine for anesthetic  With sterile technique a 6 mm punch biopsy was used to obtain a biopsy specimen of the lesion and a cavity was opened and irrigated with H2O2 on Q-tips.  A scant amount of seropurulent exudate was expressed. A clx was sent.  Hemostasis was obtained and the cavity was packed with iodofrom. The specimen is labeled and sent to pathology for evaluation. The procedure was well tolerated without complications.  Assessment & Plan:   Nader was seen today for abscess.  Diagnoses and all orders for this visit:  Benign prostatic hyperplasia without lower urinary tract symptoms  Routine adult health maintenance  Hyperlipidemia LDL goal <130  Axillary mass, right- I await pathology results to see if this is a malignancy. -     Dermatology pathology; Future -     Dermatology pathology  Abscess of axilla, right - The I and D is likely adequate treatment for the infection. I await the clx results. -     WOUND CULTURE; Future -     WOUND CULTURE   I am having Sharlet Salina C. [employer]" maintain his brimonidine-timolol, cholecalciferol, BLACK CURRANT SEED OIL PO, NON FORMULARY, Omega 3-6-9 Fatty Acids (OMEGA-3-6-9 PO), NON FORMULARY, MAGNESIUM PO, Ferrous Sulfate (IRON PO), and Coenzyme Q10 (CO Q 10 PO).  No orders of the defined types were placed in this encounter.  Follow-up: Return in about 2 days (around 04/09/2020).  Sanda Linger, MD

## 2020-04-07 NOTE — Patient Instructions (Signed)
Incision and Drainage Incision and drainage is a surgical procedure to open and drain a fluid-filled sac. The sac may be filled with pus, mucus, or blood. Examples of fluid-filled sacs that may need surgical drainage include cysts, skin infections (abscesses), and red lumps that develop from a ruptured cyst or a small abscess (boils). You may need this procedure if the affected area is large, painful, infected, or not healing well. Tell a health care provider about:  Any allergies you have.  All medicines you are taking, including vitamins, herbs, eye drops, creams, and over-the-counter medicines.  Any problems you or family members have had with anesthetic medicines.  Any blood disorders you have or have had.  Any surgeries you have had.  Any medical conditions you have or have had.  Whether you are pregnant or may be pregnant. What are the risks? Generally, this is a safe procedure. However, problems may occur, including:  Infection.  Bleeding.  Allergic reactions to medicines.  Scarring.  The cyst or abscess returns.  Damage to nerves or vessels. What happens before the procedure? Medicine Ask your health care provider about:  Changing or stopping your regular medicines. This is especially important if you are taking diabetes medicines or blood thinners.  Taking medicines such as aspirin and ibuprofen. These medicines can thin your blood. Do not take these medicines unless your health care provider tells you to take them.  Taking over-the-counter medicines, vitamins, herbs, and supplements. Tests You may have an exam or testing. These may include:  Ultrasound or other imaging tests to see how large or deep the fluid-filled sac is.  Blood tests to check for infection. General instructions  Follow instructions from your health care provider about eating or drinking restrictions.  Plan to have someone take you home from the hospital or clinic.  Ask your health care  provider whether a responsible adult should care for you for at least 24 hours after you leave the hospital or clinic. This is important.  You may get a tetanus shot.  Ask your health care provider: ? How your surgery site will be marked or identified. ? What steps will be taken to help prevent infection. These may include:  Removing hair at the surgery site.  Washing skin with a germ-killing soap.  Receiving antibiotic medicine. What happens during the procedure?   An IV may be inserted into one of your veins.  You will be given one or more of the following: ? A medicine to help you relax (sedative). ? A medicine to numb the area (local anesthetic). ? A medicine to make you fall asleep (general anesthetic).  An incision will be made in the top of the fluid-filled sac.  Pus, blood, and mucus will be squeezed out, and a syringe or tube (drain) may be used to empty more fluid from the sac.  Your health care provider will do one of the following. He or she may: ? Leave the drain in place for several weeks to drain more fluid. ? Stitch open the edges of the incision to make a long-term opening for drainage (marsupialization).  The inside of the sac may be washed out (irrigated) with a sterile solution and packed with gauze before it is covered with a bandage (dressing).  Your health care provider do a culture test of the drainage fluid. The procedure may vary among health care providers and hospitals. What happens after the procedure?  Your blood pressure, heart rate, breathing rate, and blood oxygen level   will be monitored often until you leave the hospital or clinic.  Do not drive for 24 hours if you were given a sedative during your procedure. Summary  Incision and drainage is a surgical procedure to open and drain a fluid-filled sac. The sac may be filled with pus, mucus, or blood.  Before the procedure, you may be given antibiotic medicine to treat or help prevent  infection.  During the procedure, an incision will be made in the top of the fluid-filled sac. Pus, blood, and mucus is squeezed out, and a syringe or tube (drain) may be used to empty more fluid from the sac.  The inside of the sac may be washed out (irrigated) with a sterile solution and packed with gauze before it is covered with a bandage (dressing). This information is not intended to replace advice given to you by your health care provider. Make sure you discuss any questions you have with your health care provider. Document Revised: 08/27/2018 Document Reviewed: 08/27/2018 Elsevier Patient Education  2020 Elsevier Inc.  

## 2020-04-09 ENCOUNTER — Encounter: Payer: Self-pay | Admitting: Internal Medicine

## 2020-04-09 ENCOUNTER — Ambulatory Visit (INDEPENDENT_AMBULATORY_CARE_PROVIDER_SITE_OTHER): Payer: Medicare HMO | Admitting: Internal Medicine

## 2020-04-09 ENCOUNTER — Other Ambulatory Visit: Payer: Self-pay

## 2020-04-09 VITALS — BP 110/62 | HR 59 | Temp 98.2°F | Ht 67.0 in | Wt 162.0 lb

## 2020-04-09 DIAGNOSIS — R2231 Localized swelling, mass and lump, right upper limb: Secondary | ICD-10-CM

## 2020-04-09 DIAGNOSIS — L02411 Cutaneous abscess of right axilla: Secondary | ICD-10-CM

## 2020-04-09 NOTE — Progress Notes (Signed)
Subjective:  Patient ID: Tanner Thompson, male    DOB: March 14, 1951  Age: 69 y.o. MRN: [id number]  CC: Wound Check  This visit occurred during the SARS-CoV-2 public health emergency.  Safety protocols were in place, including screening questions prior to the visit, additional usage of staff PPE, and extensive cleaning of exam room while observing appropriate contact time as indicated for disinfecting solutions.    HPI Tanner Thompson presents for f/up - He underwent incision and drainage of a subcutaneous lesion in his right axilla about 2 days ago.  The pathology shows a benign epidermal cystic lesion and the culture was unremarkable.  He tells me the area is healing nicely with no complications.  Outpatient Medications Prior to Visit  Medication Sig Dispense Refill  . BLACK CURRANT SEED OIL PO Take by mouth.    . brimonidine-timolol (COMBIGAN) 0.2-0.5 % ophthalmic solution Place 1 drop into both eyes every 12 (twelve) hours.    . cholecalciferol (VITAMIN D3) 25 MCG (1000 UT) tablet Take 1,000 Units by mouth daily.    . Coenzyme Q10 (CO Q 10 PO) Take 1 tablet by mouth as needed.    . Ferrous Sulfate (IRON PO) Take 1 tablet by mouth as needed.    Marland Kitchen MAGNESIUM PO Take 1 tablet by mouth as needed.     . NON FORMULARY Bilbery Extract    . NON FORMULARY Boswella Complex    . Omega 3-6-9 Fatty Acids (OMEGA-3-6-9 PO) Take 1 tablet by mouth daily.     No facility-administered medications prior to visit.    ROS Review of Systems  All other systems reviewed and are negative.   Objective:  BP 110/62 (BP Location: Left Arm, Patient Position: Sitting, Cuff Size: Normal)   Pulse (!) 59   Temp 98.2 F (36.8 C) (Oral)   Ht 5\' 7"  (1.702 m)   Wt 162 lb (73.5 kg)   SpO2 99%   BMI 25.37 kg/m   BP Readings from Last 3 Encounters:  04/09/20 110/62  04/07/20 106/68  08/15/19 120/70    Wt Readings from Last 3 Encounters:  04/09/20 162 lb (73.5 kg)  04/07/20 163 lb 4 oz (74 kg)   08/15/19 169 lb 12 oz (77 kg)    Physical Exam Skin:    Comments: Right axilla- The iodoform packing is removed.  The cavity is empty with no additional cyst and there is no exudate, erythema, swelling, induration, or fluctuance. See photo.     Lab Results  Component Value Date   WBC 4.4 11/21/2018   HGB 13.5 11/21/2018   HCT 41.2 11/21/2018   PLT 157.0 11/21/2018   GLUCOSE 84 11/21/2018   CHOL 185 11/21/2018   TRIG 62.0 11/21/2018   HDL 89.20 11/21/2018   LDLCALC 84 11/21/2018   ALT 14 11/21/2018   AST 17 11/21/2018   NA 139 11/21/2018   K 4.3 11/21/2018   CL 104 11/21/2018   CREATININE 1.11 11/21/2018   BUN 22 11/21/2018   CO2 29 11/21/2018   TSH 1.03 11/21/2018   PSA 0.46 11/21/2018    MR BRAIN W WO CONTRAST  Result Date: 08/15/2018  [facility] [address] [place], [place] [phone] NEUROIMAGING REPORT STUDY DATE: 08/14/2018 PATIENT NAME: Tanner Thompson DOB: 1951/02/05 MRN: [id number] ORDERING CLINICIAN:Dr Willis CLINICAL HISTORY:  67 year patient with abnormal MRI COMPARISON FILMS: MRI Brain wo 05/19/2017 EXAM: MRI Brain w/wo TECHNIQUE: MRI of the brain with and without  contrast was obtained utilizing 5 mm axial slices with T1, T2, T2 flair, T2 star gradient echo and diffusion weighted views.  T1 sagittal, T2 coronal and postcontrast views in the axial and coronal plane were obtained. CONTRAST:15 ml iv multihance IMAGING SITE: [facility] FINDINGS: The brain parenchyma shows persistent mild hyperintensity on T2-weighted and FLAIR images within the posterior lower midbrain and upper pons. When compared to the MRI dated 05/19/17, edges are unchanged in appearance.  No other structural lesion, tumor or infarcts are noted.No abnormal lesions are seen on diffusion-weighted views to suggest acute ischemia. The cortical sulci, fissures and cisterns are normal in size and appearance. Lateral, third and fourth ventricle are normal in size  and appearance. No extra-axial fluid collections are seen. No evidence of mass effect or midline shift.  No abnormal lesions are seen on post contrast views.  On sagittal views the posterior fossa, pituitary gland and corpus callosum are unremarkable. No evidence of intracranial hemorrhage on gradient-echo views. The orbits and their contents,  and calvarium are unremarkable.  Intracranial flow voids are present.  The paranasal sinuses show chronic inflammatory changes.   Abnormal MRI scan of the brain showing persistent mild T2/flair hyperintensity in the posterior lower midbrain and upper pons which appears to be unchanged compared with previous MRI from 05/19/2017.  No enhancing lesions are noted.  There also appears to be stable changes of chronic paranasal sinusitis. INTERPRETING PHYSICIAN: Delia Heady, MD Certified in  Neuroimaging by American Society of Neuroimaging and [employer]ities    Assessment & Plan:   Clinton was seen today for wound check.  Diagnoses and all orders for this visit:  Abscess of axilla, right- This has been adequately treated with I and D  Axillary mass, right- He was offered reassurance that this was a benign lesion. Will refer to general surgery if it recurs.   I am having Tanner Thompson [employer]" maintain his brimonidine-timolol, cholecalciferol, BLACK CURRANT SEED OIL PO, NON FORMULARY, Omega 3-6-9 Fatty Acids (OMEGA-3-6-9 PO), NON FORMULARY, MAGNESIUM PO, Ferrous Sulfate (IRON PO), and Coenzyme Q10 (CO Q 10 PO).  No orders of the defined types were placed in this encounter.    Follow-up: Return in about 3 weeks (around 04/30/2020).  Sanda Linger, MD

## 2020-04-09 NOTE — Patient Instructions (Signed)
Wound Care, Adult Taking care of your wound properly can help to prevent pain, infection, and scarring. It can also help your wound to heal more quickly. How to care for your wound Wound care      Follow instructions from your health care provider about how to take care of your wound. Make sure you: ? Wash your hands with soap and water before you change the bandage (dressing). If soap and water are not available, use hand sanitizer. ? Change your dressing as told by your health care provider. ? Leave stitches (sutures), skin glue, or adhesive strips in place. These skin closures may need to stay in place for 2 weeks or longer. If adhesive strip edges start to loosen and curl up, you may trim the loose edges. Do not remove adhesive strips completely unless your health care provider tells you to do that.  Check your wound area every day for signs of infection. Check for: ? Redness, swelling, or pain. ? Fluid or blood. ? Warmth. ? Pus or a bad smell.  Ask your health care provider if you should clean the wound with mild soap and water. Doing this may include: ? Using a clean towel to pat the wound dry after cleaning it. Do not rub or scrub the wound. ? Applying a cream or ointment. Do this only as told by your health care provider. ? Covering the incision with a clean dressing.  Ask your health care provider when you can leave the wound uncovered.  Keep the dressing dry until your health care provider says it can be removed. Do not take baths, swim, use a hot tub, or do anything that would put the wound underwater until your health care provider approves. Ask your health care provider if you can take showers. You may only be allowed to take sponge baths. Medicines   If you were prescribed an antibiotic medicine, cream, or ointment, take or use the antibiotic as told by your health care provider. Do not stop taking or using the antibiotic even if your condition improves.  Take  over-the-counter and prescription medicines only as told by your health care provider. If you were prescribed pain medicine, take it 30 or more minutes before you do any wound care or as told by your health care provider. General instructions  Return to your normal activities as told by your health care provider. Ask your health care provider what activities are safe.  Do not scratch or pick at the wound.  Do not use any products that contain nicotine or tobacco, such as cigarettes and e-cigarettes. These may delay wound healing. If you need help quitting, ask your health care provider.  Keep all follow-up visits as told by your health care provider. This is important.  Eat a diet that includes protein, vitamin A, vitamin C, and other nutrient-rich foods to help the wound heal. ? Foods rich in protein include meat, dairy, beans, nuts, and other sources. ? Foods rich in vitamin A include carrots and dark green, leafy vegetables. ? Foods rich in vitamin C include citrus, tomatoes, and other fruits and vegetables. ? Nutrient-rich foods have protein, carbohydrates, fat, vitamins, or minerals. Eat a variety of healthy foods including vegetables, fruits, and whole grains. Contact a health care provider if:  You received a tetanus shot and you have swelling, severe pain, redness, or bleeding at the injection site.  Your pain is not controlled with medicine.  You have redness, swelling, or pain around the wound.    You have fluid or blood coming from the wound.  Your wound feels warm to the touch.  You have pus or a bad smell coming from the wound.  You have a fever or chills.  You are nauseous or you vomit.  You are dizzy. Get help right away if:  You have a red streak going away from your wound.  The edges of the wound open up and separate.  Your wound is bleeding, and the bleeding does not stop with gentle pressure.  You have a rash.  You faint.  You have trouble  breathing. Summary  Always wash your hands with soap and water before changing your bandage (dressing).  To help with healing, eat foods that are rich in protein, vitamin A, vitamin C, and other nutrients.  Check your wound every day for signs of infection. Contact your health care provider if you suspect that your wound is infected. This information is not intended to replace advice given to you by your health care provider. Make sure you discuss any questions you have with your health care provider. Document Revised: 01/14/2019 Document Reviewed: 04/12/2016 Elsevier Patient Education  2020 [employer] Inc.  

## 2020-04-10 LAB — WOUND CULTURE

## 2020-04-29 ENCOUNTER — Encounter: Payer: Self-pay | Admitting: Internal Medicine

## 2020-04-30 ENCOUNTER — Other Ambulatory Visit: Payer: Self-pay

## 2020-04-30 ENCOUNTER — Ambulatory Visit (INDEPENDENT_AMBULATORY_CARE_PROVIDER_SITE_OTHER): Payer: Medicare HMO | Admitting: Internal Medicine

## 2020-04-30 ENCOUNTER — Encounter: Payer: Self-pay | Admitting: Internal Medicine

## 2020-04-30 VITALS — BP 114/72 | HR 63 | Temp 98.2°F | Resp 16 | Ht 67.0 in | Wt 164.0 lb

## 2020-04-30 DIAGNOSIS — Z0000 Encounter for general adult medical examination without abnormal findings: Secondary | ICD-10-CM

## 2020-04-30 DIAGNOSIS — E785 Hyperlipidemia, unspecified: Secondary | ICD-10-CM | POA: Diagnosis not present

## 2020-04-30 DIAGNOSIS — N400 Enlarged prostate without lower urinary tract symptoms: Secondary | ICD-10-CM | POA: Diagnosis not present

## 2020-04-30 DIAGNOSIS — N1831 Chronic kidney disease, stage 3a: Secondary | ICD-10-CM | POA: Diagnosis not present

## 2020-04-30 NOTE — Progress Notes (Signed)
Subjective:  Patient ID: Tanner Thompson, male    DOB: April 23, 1951  Age: 69 y.o. MRN: [id number]  CC: Annual Exam  This visit occurred during the SARS-CoV-2 public health emergency.  Safety protocols were in place, including screening questions prior to the visit, additional usage of staff PPE, and extensive cleaning of exam room while observing appropriate contact time as indicated for disinfecting solutions.    HPI FODAY CONE presents for a CPX.  He has mild nocturia - about 2 times per night.  He denies urinary hesitancy or difficulty urinating.  He also suffers from mild intermittent constipation but gets adequate symptom relief with over-the-counter remedies.  He is active and denies any recent episodes of chest pain, shortness of breath, palpitations, edema, or fatigue.  Outpatient Medications Prior to Visit  Medication Sig Dispense Refill  . BLACK CURRANT SEED OIL PO Take by mouth.    . brimonidine-timolol (COMBIGAN) 0.2-0.5 % ophthalmic solution Place 1 drop into both eyes every 12 (twelve) hours.    . cholecalciferol (VITAMIN D3) 25 MCG (1000 UT) tablet Take 1,000 Units by mouth daily.    . Coenzyme Q10 (CO Q 10 PO) Take 1 tablet by mouth as needed.    . Ferrous Sulfate (IRON PO) Take 1 tablet by mouth as needed.    Marland Kitchen MAGNESIUM PO Take 1 tablet by mouth as needed.     . NON FORMULARY Bilbery Extract    . NON FORMULARY Boswella Complex    . Omega 3-6-9 Fatty Acids (OMEGA-3-6-9 PO) Take 1 tablet by mouth daily.     No facility-administered medications prior to visit.    ROS Review of Systems  Constitutional: Negative.  Negative for diaphoresis, fatigue and unexpected weight change.  HENT: Negative.   Eyes: Negative.   Respiratory: Negative for cough, chest tightness, shortness of breath and wheezing.   Cardiovascular: Negative for chest pain, palpitations and leg swelling.  Gastrointestinal: Positive for constipation. Negative for abdominal pain, blood in stool,  diarrhea, nausea and vomiting.  Endocrine: Negative.   Genitourinary: Negative.  Negative for difficulty urinating.  Musculoskeletal: Negative.   Skin: Negative.  Negative for color change.  Neurological: Negative.  Negative for dizziness, weakness, light-headedness and headaches.  Hematological: Negative for adenopathy. Does not bruise/bleed easily.  Psychiatric/Behavioral: Negative.     Objective:  BP 114/72   Pulse 63   Temp 98.2 F (36.8 C) (Oral)   Resp 16   Ht 5\' 7"  (1.702 m)   Wt 164 lb (74.4 kg)   SpO2 99%   BMI 25.69 kg/m   BP Readings from Last 3 Encounters:  04/30/20 114/72  04/09/20 110/62  04/07/20 106/68    Wt Readings from Last 3 Encounters:  04/30/20 164 lb (74.4 kg)  04/09/20 162 lb (73.5 kg)  04/07/20 163 lb 4 oz (74 kg)    Physical Exam Vitals reviewed. Exam conducted with a chaperone present 04/09/20).  Constitutional:      Appearance: Normal appearance.  HENT:     Nose: Nose normal.     Mouth/Throat:     Mouth: Mucous membranes are moist.  Eyes:     General: No scleral icterus.    Conjunctiva/sclera: Conjunctivae normal.  Cardiovascular:     Rate and Rhythm: Normal rate and regular rhythm.     Heart sounds: No murmur heard.   Pulmonary:     Effort: Pulmonary effort is normal.     Breath sounds: No stridor. No wheezing, rhonchi or rales.  Abdominal:     General: Abdomen is flat.     Palpations: There is no mass.     Tenderness: There is no abdominal tenderness. There is no guarding.     Hernia: No hernia is present. There is no hernia in the left inguinal area or right inguinal area.  Genitourinary:    Pubic Area: No rash.      Penis: Normal and circumcised.      Testes: Normal.        Right: Mass not present.        Left: Mass not present.     Epididymis:     Right: Normal. Not inflamed or enlarged.     Left: Normal. Not inflamed or enlarged.     Prostate: Enlarged (1+ smooth symm BPH). Not tender and no nodules present.      Rectum: Normal. Guaiac result negative. No mass, tenderness, anal fissure, external hemorrhoid or internal hemorrhoid. Normal anal tone.  Musculoskeletal:        General: Normal range of motion.     Cervical back: Neck supple.     Right lower leg: No edema.     Left lower leg: No edema.  Lymphadenopathy:     Cervical: No cervical adenopathy.     Lower Body: No right inguinal adenopathy. No left inguinal adenopathy.  Skin:    General: Skin is warm and dry.  Neurological:     General: No focal deficit present.     Mental Status: He is alert.  Psychiatric:        Mood and Affect: Mood normal.     Lab Results  Component Value Date   WBC 4.2 04/30/2020   HGB 13.2 04/30/2020   HCT 41.3 04/30/2020   PLT 168 04/30/2020   GLUCOSE 87 04/30/2020   CHOL 187 04/30/2020   TRIG 66 04/30/2020   HDL 96 04/30/2020   LDLCALC 76 04/30/2020   ALT 18 04/30/2020   AST 17 04/30/2020   NA 138 04/30/2020   K 4.3 04/30/2020   CL 104 04/30/2020   CREATININE 1.22 (H) 04/30/2020   BUN 21 04/30/2020   CO2 26 04/30/2020   TSH 0.88 04/30/2020   PSA 0.3 04/30/2020    MR BRAIN W WO CONTRAST  Result Date: 08/15/2018  [facility] [address] [place], [place] [address] [phone] NEUROIMAGING REPORT STUDY DATE: 08/14/2018 PATIENT NAME: Tanner Thompson DOB: [birth year] MRN: [id number] ORDERING CLINICIAN:Dr Willis CLINICAL HISTORY:  67 year patient with abnormal MRI COMPARISON FILMS: MRI Brain wo 05/19/2017 EXAM: MRI Brain w/wo TECHNIQUE: MRI of the brain with and without contrast was obtained utilizing 5 mm axial slices with T1, T2, T2 flair, T2 star gradient echo and diffusion weighted views.  T1 sagittal, T2 coronal and postcontrast views in the axial and coronal plane were obtained. CONTRAST:15 ml iv multihance IMAGING SITE: [facility] FINDINGS: The brain parenchyma shows persistent mild hyperintensity on T2-weighted and FLAIR images within the posterior lower midbrain and  upper pons. When compared to the MRI dated 05/19/17, edges are unchanged in appearance.  No other structural lesion, tumor or infarcts are noted.No abnormal lesions are seen on diffusion-weighted views to suggest acute ischemia. The cortical sulci, fissures and cisterns are normal in size and appearance. Lateral, third and fourth ventricle are normal in size and appearance. No extra-axial fluid collections are seen. No evidence of mass effect or midline shift.  No abnormal lesions are seen on post contrast views.  On sagittal  views the posterior fossa, pituitary gland and corpus callosum are unremarkable. No evidence of intracranial hemorrhage on gradient-echo views. The orbits and their contents,  and calvarium are unremarkable.  Intracranial flow voids are present.  The paranasal sinuses show chronic inflammatory changes.   Abnormal MRI scan of the brain showing persistent mild T2/flair hyperintensity in the posterior lower midbrain and upper pons which appears to be unchanged compared with previous MRI from 05/19/2017.  No enhancing lesions are noted.  There also appears to be stable changes of chronic paranasal sinusitis. INTERPRETING PHYSICIAN: PRAMOD SETHI, MD Certified in  Neuroimaging by American Society of Neuroimaging and [employer]    Assessment & Plan:   Imanol was seen today for annual exam.  Diagnoses and all orders for this visit:  Benign prostatic hyperplasia without lower urinary tract symptoms- His PSA is normal which is a reassuring sign that he does not have prostate cancer.  He does not want to treat his symptoms. -     Basic metabolic panel; Future -     Urinalysis, Routine w reflex microscopic; Future -     PSA; Future -     PSA -     Urinalysis, Routine w reflex microscopic -     Basic metabolic panel  Hyperlipidemia LDL goal <130- He does not have an elevated ASCVD risk score so I did not recommend a statin for CV risk reduction. -      CBC with Differential/Platelet; Future -     Basic metabolic panel; Future -     Lipid panel; Future -     TSH; Future -     Hepatic function panel; Future -     Hepatic function panel -     TSH -     Lipid panel -     Basic metabolic panel -     CBC with Differential/Platelet  Routine adult health maintenance- Exam completed, labs reviewed, vaccines reviewed and updated, cancer screenings are up-to-date, patient education material was given.  Stage 3a chronic kidney disease- He has mild renal insufficiency.  He does not take any nephrotoxic agents and will avoid nephrotoxic agents.  His blood pressure is adequately well controlled.  Will continue to monitor this.   I am having Sharlet Salina C. [employer]" maintain his brimonidine-timolol, cholecalciferol, BLACK CURRANT SEED OIL PO, NON FORMULARY, Omega 3-6-9 Fatty Acids (OMEGA-3-6-9 PO), NON FORMULARY, MAGNESIUM PO, Ferrous Sulfate (IRON PO), and Coenzyme Q10 (CO Q 10 PO).  No orders of the defined types were placed in this encounter.    Follow-up: Return in about 1 year (around 04/30/2021).  Sanda Linger, MD

## 2020-04-30 NOTE — Patient Instructions (Signed)

## 2020-05-01 DIAGNOSIS — N1831 Chronic kidney disease, stage 3a: Secondary | ICD-10-CM | POA: Insufficient documentation

## 2020-05-01 LAB — URINALYSIS, ROUTINE W REFLEX MICROSCOPIC
Bilirubin Urine: NEGATIVE
Glucose, UA: NEGATIVE
Hgb urine dipstick: NEGATIVE
Ketones, ur: NEGATIVE
Leukocytes,Ua: NEGATIVE
Nitrite: NEGATIVE
Protein, ur: NEGATIVE
Specific Gravity, Urine: 1.024 (ref 1.001–1.03)
pH: 7.5 (ref 5.0–8.0)

## 2020-05-01 LAB — BASIC METABOLIC PANEL
BUN/Creatinine Ratio: 17 (calc) (ref 6–22)
BUN: 21 mg/dL (ref 7–25)
CO2: 26 mmol/L (ref 20–32)
Calcium: 8.9 mg/dL (ref 8.6–10.3)
Chloride: 104 mmol/L (ref 98–110)
Creat: 1.22 mg/dL — ABNORMAL HIGH (ref 0.70–1.18)
Glucose, Bld: 87 mg/dL (ref 65–99)
Potassium: 4.3 mmol/L (ref 3.5–5.3)
Sodium: 138 mmol/L (ref 135–146)

## 2020-05-01 LAB — CBC WITH DIFFERENTIAL/PLATELET
Absolute Monocytes: 437 cells/uL (ref 200–950)
Basophils Absolute: 29 cells/uL (ref 0–200)
Basophils Relative: 0.7 %
Eosinophils Absolute: 122 cells/uL (ref 15–500)
Eosinophils Relative: 2.9 %
HCT: 41.3 % (ref 38.5–50.0)
Hemoglobin: 13.2 g/dL (ref 13.2–17.1)
Lymphs Abs: 2083 cells/uL (ref 850–3900)
MCH: 25.8 pg — ABNORMAL LOW (ref 27.0–33.0)
MCHC: 32 g/dL (ref 32.0–36.0)
MCV: 80.7 fL (ref 80.0–100.0)
MPV: 11 fL (ref 7.5–12.5)
Monocytes Relative: 10.4 %
Neutro Abs: 1529 cells/uL (ref 1500–7800)
Neutrophils Relative %: 36.4 %
Platelets: 168 10*3/uL (ref 140–400)
RBC: 5.12 10*6/uL (ref 4.20–5.80)
RDW: 13.1 % (ref 11.0–15.0)
Total Lymphocyte: 49.6 %
WBC: 4.2 10*3/uL (ref 3.8–10.8)

## 2020-05-01 LAB — HEPATIC FUNCTION PANEL
AG Ratio: 1.5 (calc) (ref 1.0–2.5)
ALT: 18 U/L (ref 9–46)
AST: 17 U/L (ref 10–35)
Albumin: 4.3 g/dL (ref 3.6–5.1)
Alkaline phosphatase (APISO): 104 U/L (ref 35–144)
Bilirubin, Direct: 0.2 mg/dL (ref 0.0–0.2)
Globulin: 2.8 g/dL (calc) (ref 1.9–3.7)
Indirect Bilirubin: 0.7 mg/dL (calc) (ref 0.2–1.2)
Total Bilirubin: 0.9 mg/dL (ref 0.2–1.2)
Total Protein: 7.1 g/dL (ref 6.1–8.1)

## 2020-05-01 LAB — LIPID PANEL
Cholesterol: 187 mg/dL (ref <200)
HDL: 96 mg/dL (ref >=40)
LDL Cholesterol (Calc): 76 mg/dL (calc)
Non-HDL Cholesterol (Calc): 91 mg/dL (calc) (ref <130)
Total CHOL/HDL Ratio: 1.9 (calc) (ref <5.0)
Triglycerides: 66 mg/dL (ref <150)

## 2020-05-01 LAB — PSA: PSA: 0.3 ng/mL (ref <=4.0)

## 2020-05-01 LAB — TSH: TSH: 0.88 mIU/L (ref 0.40–4.50)

## 2020-05-05 DIAGNOSIS — H401131 Primary open-angle glaucoma, bilateral, mild stage: Secondary | ICD-10-CM | POA: Diagnosis not present

## 2020-05-19 DIAGNOSIS — Z0100 Encounter for examination of eyes and vision without abnormal findings: Secondary | ICD-10-CM | POA: Diagnosis not present

## 2020-06-04 ENCOUNTER — Encounter: Payer: Self-pay | Admitting: Internal Medicine

## 2020-06-29 ENCOUNTER — Encounter: Payer: Self-pay | Admitting: Internal Medicine

## 2020-07-01 DIAGNOSIS — Z20822 Contact with and (suspected) exposure to covid-19: Secondary | ICD-10-CM | POA: Diagnosis not present

## 2020-10-22 DIAGNOSIS — Z20822 Contact with and (suspected) exposure to covid-19: Secondary | ICD-10-CM | POA: Diagnosis not present

## 2020-10-23 DIAGNOSIS — Z20822 Contact with and (suspected) exposure to covid-19: Secondary | ICD-10-CM | POA: Diagnosis not present

## 2020-11-17 DIAGNOSIS — H401131 Primary open-angle glaucoma, bilateral, mild stage: Secondary | ICD-10-CM | POA: Diagnosis not present

## 2021-02-19 ENCOUNTER — Encounter: Payer: Self-pay | Admitting: Internal Medicine

## 2021-02-24 ENCOUNTER — Encounter: Payer: Self-pay | Admitting: Internal Medicine

## 2021-02-24 ENCOUNTER — Other Ambulatory Visit: Payer: Self-pay

## 2021-02-24 ENCOUNTER — Ambulatory Visit (INDEPENDENT_AMBULATORY_CARE_PROVIDER_SITE_OTHER): Payer: Medicare HMO | Admitting: Internal Medicine

## 2021-02-24 ENCOUNTER — Telehealth: Payer: Self-pay

## 2021-02-24 DIAGNOSIS — J309 Allergic rhinitis, unspecified: Secondary | ICD-10-CM

## 2021-02-24 DIAGNOSIS — H9191 Unspecified hearing loss, right ear: Secondary | ICD-10-CM | POA: Diagnosis not present

## 2021-02-24 DIAGNOSIS — H9203 Otalgia, bilateral: Secondary | ICD-10-CM

## 2021-02-24 NOTE — Assessment & Plan Note (Signed)
Very mild, for otc zyrtec prn,  to f/u any worsening symptoms or concerns

## 2021-02-24 NOTE — Addendum Note (Signed)
Addended by: Corwin Levins on: 02/24/2021 04:32 PM   Modules accepted: Orders

## 2021-02-24 NOTE — Telephone Encounter (Signed)
Follow up from recent mychart message. Called to get mor clarity left voicemail for patient to call office back with more details from the ear bleeding.

## 2021-02-24 NOTE — Patient Instructions (Signed)
Your ears were irrigated of wax impactions today  OK to try OTC zyrtec or allegra for allergy symptoms if needed  Please continue all other medications as before, and refills have been done if requested.  Please have the pharmacy call with any other refills you may need.  Please continue your efforts at being more active, low cholesterol diet, and weight control.  Please keep your appointments with your specialists as you may have planned

## 2021-02-24 NOTE — Telephone Encounter (Signed)
Ok this is done 

## 2021-02-24 NOTE — Telephone Encounter (Signed)
Ok for cotton balls to ears for now; may ned to consider UC or ED if the bleeding persists, but this would be extemely rare.  There should be no infection to be concerned at this time.  If bleeding persists by tomorrow, please let us know so that ENT referral can be done

## 2021-02-24 NOTE — Progress Notes (Signed)
Patient ID: Tanner Thompson, male   DOB: 1951/02/08, 70 y.o.   MRN: [id number]        Chief Complaint: right > left hearing loss       HPI:  Tanner Thompson is a 70 y.o. male here with c/o 1 wk onset muffled low hearing on the right much more than the left (which currently is very minor intermittent) he believes is wax impaction related as this has happened before,  Denies HA, fever, chills, ST, cough, and Pt denies chest pain, increased sob or doe, wheezing, orthopnea, PND, increased LE swelling, palpitations, dizziness or syncope.  Has had some very mild nasal allergy congestion but not really even enough to consider having to take any otc med such as zyrtec.  No other new complaints       Wt Readings from Last 3 Encounters:  02/24/21 164 lb (74.4 kg)  04/30/20 164 lb (74.4 kg)  04/09/20 162 lb (73.5 kg)   BP Readings from Last 3 Encounters:  02/24/21 108/70  04/30/20 114/72  04/09/20 110/62         Past Medical History:  Diagnosis Date  . Diplopia   . Glaucoma   . Pancytopenia (HCC) 12/28/2016   Past Surgical History:  Procedure Laterality Date  . CATARACT EXTRACTION Bilateral    One eye laser removal, other removed about 15 yr ago  . HEMORROIDECTOMY      reports that he has never smoked. He has never used smokeless tobacco. He reports current alcohol use. He reports that he does not use drugs. family history includes Breast cancer in his mother; Prostate cancer in his father. No Known Allergies Current Outpatient Medications on File Prior to Visit  Medication Sig Dispense Refill  . BLACK CURRANT SEED OIL PO Take by mouth.    . brimonidine-timolol (COMBIGAN) 0.2-0.5 % ophthalmic solution Place 1 drop into both eyes every 12 (twelve) hours.    . cholecalciferol (VITAMIN D3) 25 MCG (1000 UT) tablet Take 1,000 Units by mouth daily.    . Coenzyme Q10 (CO Q 10 PO) Take 1 tablet by mouth as needed.    . Ferrous Sulfate (IRON PO) Take 1 tablet by mouth as needed.    Marland Kitchen MAGNESIUM PO  Take 1 tablet by mouth as needed.     . NON FORMULARY Bilbery Extract    . NON FORMULARY Boswella Complex    . Omega 3-6-9 Fatty Acids (OMEGA-3-6-9 PO) Take 1 tablet by mouth daily.     No current facility-administered medications on file prior to visit.        ROS:  All others reviewed and negative.  Objective        PE:  BP 108/70 (BP Location: Left Arm, Patient Position: Sitting, Cuff Size: Normal)   Pulse 60   Temp 98.1 F (36.7 C) (Oral)   Ht 5\' 7"  (1.702 m)   Wt 164 lb (74.4 kg)   SpO2 100%   BMI 25.69 kg/m                 Constitutional: Pt appears in NAD               HENT: Head: NCAT.                Right Ear: External ear normal.                 Left Ear: External ear normal.  Bilateral wax impactions resolved with irrigation and hearing improved               Eyes: . Pupils are equal, round, and reactive to light. Conjunctivae and EOM are normal               Nose: without d/c or deformity               Neck: Neck supple. Gross normal ROM               Cardiovascular: Normal rate and regular rhythm.                 Pulmonary/Chest: Effort normal and breath sounds without rales or wheezing.                              Neurological: Pt is alert. At baseline orientation, motor grossly intact               Skin: Skin is warm. No rashes, no other new lesions, LE edema - none               Psychiatric: Pt behavior is normal without agitation   Micro: none  Cardiac tracings I have personally interpreted today:  none  Pertinent Radiological findings (summarize): none   Lab Results  Component Value Date   WBC 4.2 04/30/2020   HGB 13.2 04/30/2020   HCT 41.3 04/30/2020   PLT 168 04/30/2020   GLUCOSE 87 04/30/2020   CHOL 187 04/30/2020   TRIG 66 04/30/2020   HDL 96 04/30/2020   LDLCALC 76 04/30/2020   ALT 18 04/30/2020   AST 17 04/30/2020   NA 138 04/30/2020   K 4.3 04/30/2020   CL 104 04/30/2020   CREATININE 1.22 (H) 04/30/2020   BUN 21  04/30/2020   CO2 26 04/30/2020   TSH 0.88 04/30/2020   PSA 0.3 04/30/2020   Assessment/Plan:  Tanner Thompson is a 70 y.o. Black or African American [2] male with  has a past medical history of Diplopia, Glaucoma, and Pancytopenia (HCC) (12/28/2016).  Acute hearing loss of right ear With bilateral wax impactions, now resolved and hearing improved, ok to continue to monitor for any worsening s/s  Allergic rhinitis Very mild, for otc zyrtec prn,  to f/u any worsening symptoms or concerns  Followup: Return if symptoms worsen or fail to improve.  Oliver Barre, MD 02/24/2021 9:11 AM [facility]  Primary Care - [facility]

## 2021-02-24 NOTE — Assessment & Plan Note (Signed)
With bilateral wax impactions, now resolved and hearing improved, ok to continue to monitor for any worsening s/s

## 2021-02-25 ENCOUNTER — Telehealth: Payer: Self-pay

## 2021-02-25 ENCOUNTER — Encounter: Payer: Self-pay | Admitting: Internal Medicine

## 2021-02-28 ENCOUNTER — Encounter: Payer: Self-pay | Admitting: Internal Medicine

## 2021-03-01 ENCOUNTER — Encounter: Payer: Self-pay | Admitting: Internal Medicine

## 2021-03-01 ENCOUNTER — Other Ambulatory Visit: Payer: Self-pay

## 2021-03-01 ENCOUNTER — Ambulatory Visit (INDEPENDENT_AMBULATORY_CARE_PROVIDER_SITE_OTHER): Payer: Medicare HMO | Admitting: Internal Medicine

## 2021-03-01 DIAGNOSIS — F419 Anxiety disorder, unspecified: Secondary | ICD-10-CM

## 2021-03-01 DIAGNOSIS — H6122 Impacted cerumen, left ear: Secondary | ICD-10-CM

## 2021-03-01 DIAGNOSIS — H9203 Otalgia, bilateral: Secondary | ICD-10-CM | POA: Diagnosis not present

## 2021-03-01 MED ORDER — LEVOFLOXACIN 500 MG PO TABS
500.0000 mg | ORAL_TABLET | Freq: Every day | ORAL | 0 refills | Status: AC
Start: 2021-03-01 — End: 2021-03-11

## 2021-03-01 MED ORDER — TRAMADOL HCL 50 MG PO TABS: 50.0000 mg | ORAL_TABLET | Freq: Four times a day (QID) | ORAL | 0 refills | Status: DC | PRN

## 2021-03-01 NOTE — Patient Instructions (Signed)
Please take all new medication as prescribed - the pain medication, and antibiotic  Please continue all other medications as before, and refills have been done if requested.  Please have the pharmacy call with any other refills you may need.  Please keep your appointments with your specialists as you may have planned - ENT as you have planned

## 2021-03-01 NOTE — Assessment & Plan Note (Signed)
No further attempts at irrigation today, likely need currette vs suction per ENT, will defer to ENT

## 2021-03-01 NOTE — Assessment & Plan Note (Signed)
With possible bilateral otitis externa - for levaquin course, tramadol prn, f/y ENT as able

## 2021-03-01 NOTE — Assessment & Plan Note (Signed)
Pt here with wife, unfortuantely I cannot improve the referral process likely slowed related to covid,  Tried to reassure

## 2021-03-01 NOTE — Progress Notes (Signed)
Patient ID: Tanner Thompson, male   DOB: [birth year], 70 y.o.   MRN: [id number]        Chief Complaint: bilateral ear pain       HPI:  Tanner Thompson is a 70 y.o. male here with c/o bilat ear pain 5 days after attempted irrigation of wax impactions, with some flaking of wx particles and dried blood and reduced hearing on the left.  No HA, fever, chills, sinus congestion, ST, cough, and Pt denies chest pain, increased sob or doe, wheezing, orthopnea, PND, increased LE swelling, palpitations, dizziness or syncope.   Pt denies polydipsia, polyuria, or new focal neuro s/s.  Has referral to ENT but unable to be seen until June 20.         Wt Readings from Last 3 Encounters:  03/01/21 167 lb (75.8 kg)  02/24/21 164 lb (74.4 kg)  04/30/20 164 lb (74.4 kg)   BP Readings from Last 3 Encounters:  03/01/21 120/68  02/24/21 108/70  04/30/20 114/72         Past Medical History:  Diagnosis Date  . Diplopia   . Glaucoma   . Pancytopenia (HCC) 12/28/2016   Past Surgical History:  Procedure Laterality Date  . CATARACT EXTRACTION Bilateral    One eye laser removal, other removed about 15 yr ago  . HEMORROIDECTOMY      reports that he has never smoked. He has never used smokeless tobacco. He reports current alcohol use. He reports that he does not use drugs. family history includes Breast cancer in his mother; Prostate cancer in his father. No Known Allergies Current Outpatient Medications on File Prior to Visit  Medication Sig Dispense Refill  . BLACK CURRANT SEED OIL PO Take by mouth.    . brimonidine-timolol (COMBIGAN) 0.2-0.5 % ophthalmic solution Place 1 drop into both eyes every 12 (twelve) hours.    . cholecalciferol (VITAMIN D3) 25 MCG (1000 UT) tablet Take 1,000 Units by mouth daily.    . Coenzyme Q10 (CO Q 10 PO) Take 1 tablet by mouth as needed.    . Ferrous Sulfate (IRON PO) Take 1 tablet by mouth as needed.    Marland Kitchen MAGNESIUM PO Take 1 tablet by mouth as needed.     . NON FORMULARY  Bilbery Extract    . NON FORMULARY Boswella Complex    . Omega 3-6-9 Fatty Acids (OMEGA-3-6-9 PO) Take 1 tablet by mouth daily.     No current facility-administered medications on file prior to visit.        ROS:  All others reviewed and negative.  Objective        PE:  BP 120/68 (BP Location: Right Arm, Patient Position: Sitting, Cuff Size: Normal)   Pulse 74   Temp 99.2 F (37.3 C) (Oral)   Ht 5\' 7"  (1.702 m)   Wt 167 lb (75.8 kg)   SpO2 99%   BMI 26.16 kg/m                 Constitutional: Pt appears in NAD               HENT: Head: NCAT.                Right Ear: External ear normal.                 Left Ear: External ear normal.                Left wax impaction persists  with small post canal aspect dried blood, possibly mild canal swelling as well               Right canal mostly clear except for small non obstructing wax material and dried blood to canal near the trague as well               Eyes: . Pupils are equal, round, and reactive to light. Conjunctivae and EOM are normal               Nose: without d/c or deformity               Neck: Neck supple. Gross normal ROM               Cardiovascular: Normal rate and regular rhythm.                 Pulmonary/Chest: Effort normal and breath sounds without rales or wheezing.                               Neurological: Pt is alert. At baseline orientation, motor grossly intact               Skin: Skin is warm. No rashes, no other new lesions, LE edema - none               Psychiatric: Pt behavior is normal without agitation   Micro: none  Cardiac tracings I have personally interpreted today:  none  Pertinent Radiological findings (summarize): none   Lab Results  Component Value Date   WBC 4.2 04/30/2020   HGB 13.2 04/30/2020   HCT 41.3 04/30/2020   PLT 168 04/30/2020   GLUCOSE 87 04/30/2020   CHOL 187 04/30/2020   TRIG 66 04/30/2020   HDL 96 04/30/2020   LDLCALC 76 04/30/2020   ALT 18 04/30/2020   AST 17  04/30/2020   NA 138 04/30/2020   K 4.3 04/30/2020   CL 104 04/30/2020   CREATININE 1.22 (H) 04/30/2020   BUN 21 04/30/2020   CO2 26 04/30/2020   TSH 0.88 04/30/2020   PSA 0.3 04/30/2020   Assessment/Plan:  Tanner Thompson is a 70 y.o. Black or African American [2] male with  has a past medical history of Diplopia, Glaucoma, and Pancytopenia (HCC) (12/28/2016).  Acute ear pain, bilateral With possible bilateral otitis externa - for levaquin course, tramadol prn, f/y ENT as able  Excessive ear wax, left No further attempts at irrigation today, likely need currette vs suction per ENT, will defer to ENT  Anxiety Pt here with wife, unfortuantely I cannot improve the referral process likely slowed related to covid,  Tried to reassure  Followup: Return if symptoms worsen or fail to improve.  Oliver Barre, MD 03/01/2021 8:14 PM [facility] [place] [facility] - [facility]

## 2021-03-02 DIAGNOSIS — H60323 Hemorrhagic otitis externa, bilateral: Secondary | ICD-10-CM | POA: Diagnosis not present

## 2021-03-05 DIAGNOSIS — H60323 Hemorrhagic otitis externa, bilateral: Secondary | ICD-10-CM | POA: Diagnosis not present

## 2021-03-17 ENCOUNTER — Encounter: Payer: Self-pay | Admitting: Internal Medicine

## 2021-03-22 DIAGNOSIS — H60323 Hemorrhagic otitis externa, bilateral: Secondary | ICD-10-CM | POA: Diagnosis not present

## 2021-03-29 ENCOUNTER — Ambulatory Visit (INDEPENDENT_AMBULATORY_CARE_PROVIDER_SITE_OTHER): Payer: Medicare HMO | Admitting: Otolaryngology

## 2021-04-05 NOTE — Telephone Encounter (Signed)
error 

## 2021-05-03 ENCOUNTER — Other Ambulatory Visit: Payer: Self-pay

## 2021-05-03 ENCOUNTER — Encounter: Payer: Self-pay | Admitting: Internal Medicine

## 2021-05-03 ENCOUNTER — Ambulatory Visit (INDEPENDENT_AMBULATORY_CARE_PROVIDER_SITE_OTHER): Payer: Medicare HMO | Admitting: Internal Medicine

## 2021-05-03 VITALS — BP 116/68 | HR 58 | Temp 98.5°F | Resp 16 | Ht 67.0 in | Wt 163.0 lb

## 2021-05-03 DIAGNOSIS — Z0001 Encounter for general adult medical examination with abnormal findings: Secondary | ICD-10-CM | POA: Diagnosis not present

## 2021-05-03 DIAGNOSIS — E785 Hyperlipidemia, unspecified: Secondary | ICD-10-CM | POA: Diagnosis not present

## 2021-05-03 DIAGNOSIS — N400 Enlarged prostate without lower urinary tract symptoms: Secondary | ICD-10-CM

## 2021-05-03 DIAGNOSIS — R001 Bradycardia, unspecified: Secondary | ICD-10-CM | POA: Diagnosis not present

## 2021-05-03 DIAGNOSIS — N1831 Chronic kidney disease, stage 3a: Secondary | ICD-10-CM

## 2021-05-03 LAB — CBC WITH DIFFERENTIAL/PLATELET
Basophils Absolute: 0 10*3/uL (ref 0.0–0.1)
Basophils Relative: 0.6 % (ref 0.0–3.0)
Eosinophils Absolute: 0.1 10*3/uL (ref 0.0–0.7)
Eosinophils Relative: 2.7 % (ref 0.0–5.0)
HCT: 38.1 % — ABNORMAL LOW (ref 39.0–52.0)
Hemoglobin: 12.4 g/dL — ABNORMAL LOW (ref 13.0–17.0)
Lymphocytes Relative: 41.7 % (ref 12.0–46.0)
Lymphs Abs: 1.3 10*3/uL (ref 0.7–4.0)
MCHC: 32.6 g/dL (ref 30.0–36.0)
MCV: 80.6 fl (ref 78.0–100.0)
Monocytes Absolute: 0.4 10*3/uL (ref 0.1–1.0)
Monocytes Relative: 11.4 % (ref 3.0–12.0)
Neutro Abs: 1.4 10*3/uL (ref 1.4–7.7)
Neutrophils Relative %: 43.6 % (ref 43.0–77.0)
Platelets: 154 10*3/uL (ref 150.0–400.0)
RBC: 4.73 Mil/uL (ref 4.22–5.81)
RDW: 14.1 % (ref 11.5–15.5)
WBC: 3.2 10*3/uL — ABNORMAL LOW (ref 4.0–10.5)

## 2021-05-03 LAB — LIPID PANEL
Cholesterol: 175 mg/dL (ref 0–200)
HDL: 88.8 mg/dL (ref >39.00)
LDL Cholesterol: 75 mg/dL (ref 0–99)
NonHDL: 85.83
Total CHOL/HDL Ratio: 2
Triglycerides: 54 mg/dL (ref 0.0–149.0)
VLDL: 10.8 mg/dL (ref 0.0–40.0)

## 2021-05-03 LAB — BASIC METABOLIC PANEL
BUN: 17 mg/dL (ref 6–23)
CO2: 27 mEq/L (ref 19–32)
Calcium: 8.9 mg/dL (ref 8.4–10.5)
Chloride: 105 mEq/L (ref 96–112)
Creatinine, Ser: 1.18 mg/dL (ref 0.40–1.50)
GFR: 62.07 mL/min (ref >60.00)
Glucose, Bld: 102 mg/dL — ABNORMAL HIGH (ref 70–99)
Potassium: 4.7 mEq/L (ref 3.5–5.1)
Sodium: 137 mEq/L (ref 135–145)

## 2021-05-03 LAB — URINALYSIS, ROUTINE W REFLEX MICROSCOPIC
Bilirubin Urine: NEGATIVE
Hgb urine dipstick: NEGATIVE
Ketones, ur: NEGATIVE
Leukocytes,Ua: NEGATIVE
Nitrite: NEGATIVE
RBC / HPF: NONE SEEN (ref 0–?)
Specific Gravity, Urine: 1.02 (ref 1.000–1.030)
Total Protein, Urine: NEGATIVE
Urine Glucose: NEGATIVE
Urobilinogen, UA: 0.2 (ref 0.0–1.0)
pH: 5.5 (ref 5.0–8.0)

## 2021-05-03 LAB — HEPATIC FUNCTION PANEL
ALT: 14 U/L (ref 0–53)
AST: 18 U/L (ref 0–37)
Albumin: 4.2 g/dL (ref 3.5–5.2)
Alkaline Phosphatase: 110 U/L (ref 39–117)
Bilirubin, Direct: 0.2 mg/dL (ref 0.0–0.3)
Total Bilirubin: 0.7 mg/dL (ref 0.2–1.2)
Total Protein: 6.8 g/dL (ref 6.0–8.3)

## 2021-05-03 LAB — PSA: PSA: 0.51 ng/mL (ref 0.10–4.00)

## 2021-05-03 LAB — TSH: TSH: 1.23 u[IU]/mL (ref 0.35–5.50)

## 2021-05-03 NOTE — Patient Instructions (Signed)
Health Maintenance, Male Adopting a healthy lifestyle and getting preventive care are important in promoting health and wellness. Ask your health care provider about: The right schedule for you to have regular tests and exams. Things you can do on your own to prevent diseases and keep yourself healthy. What should I know about diet, weight, and exercise? Eat a healthy diet  Eat a diet that includes plenty of vegetables, fruits, low-fat dairy products, and lean protein. Do not eat a lot of foods that are high in solid fats, added sugars, or sodium.  Maintain a healthy weight Body mass index (BMI) is a measurement that can be used to identify possible weight problems. It estimates body fat based on height and weight. Your health care provider can help determine your BMI and help you achieve or maintain ahealthy weight. Get regular exercise Get regular exercise. This is one of the most important things you can do for your health. Most adults should: Exercise for at least 150 minutes each week. The exercise should increase your heart rate and make you sweat (moderate-intensity exercise). Do strengthening exercises at least twice a week. This is in addition to the moderate-intensity exercise. Spend less time sitting. Even light physical activity can be beneficial. Watch cholesterol and blood lipids Have your blood tested for lipids and cholesterol at 71 years of age, then havethis test every 5 years. You may need to have your cholesterol levels checked more often if: Your lipid or cholesterol levels are high. You are older than 71 years of age. You are at high risk for heart disease. What should I know about cancer screening? Many types of cancers can be detected early and may often be prevented. Depending on your health history and family history, you may need to have cancer screening at various ages. This may include screening for: Colorectal cancer. Prostate cancer. Skin cancer. Lung  cancer. What should I know about heart disease, diabetes, and high blood pressure? Blood pressure and heart disease High blood pressure causes heart disease and increases the risk of stroke. This is more likely to develop in people who have high blood pressure readings, are of African descent, or are overweight. Talk with your health care provider about your target blood pressure readings. Have your blood pressure checked: Every 3-5 years if you are 18-39 years of age. Every year if you are 40 years old or older. If you are between the ages of 65 and 75 and are a current or former smoker, ask your health care provider if you should have a one-time screening for abdominal aortic aneurysm (AAA). Diabetes Have regular diabetes screenings. This checks your fasting blood sugar level. Have the screening done: Once every three years after age 45 if you are at a normal weight and have a low risk for diabetes. More often and at a younger age if you are overweight or have a high risk for diabetes. What should I know about preventing infection? Hepatitis B If you have a higher risk for hepatitis B, you should be screened for this virus. Talk with your health care provider to find out if you are at risk forhepatitis B infection. Hepatitis C Blood testing is recommended for: Everyone born from 1945 through 1965. Anyone with known risk factors for hepatitis C. Sexually transmitted infections (STIs) You should be screened each year for STIs, including gonorrhea and chlamydia, if: You are sexually active and are younger than 71 years of age. You are older than 71 years of age   and your health care provider tells you that you are at risk for this type of infection. Your sexual activity has changed since you were last screened, and you are at increased risk for chlamydia or gonorrhea. Ask your health care provider if you are at risk. Ask your health care provider about whether you are at high risk for HIV.  Your health care provider may recommend a prescription medicine to help prevent HIV infection. If you choose to take medicine to prevent HIV, you should first get tested for HIV. You should then be tested every 3 months for as long as you are taking the medicine. Follow these instructions at home: Lifestyle Do not use any products that contain nicotine or tobacco, such as cigarettes, e-cigarettes, and chewing tobacco. If you need help quitting, ask your health care provider. Do not use street drugs. Do not share needles. Ask your health care provider for help if you need support or information about quitting drugs. Alcohol use Do not drink alcohol if your health care provider tells you not to drink. If you drink alcohol: Limit how much you have to 0-2 drinks a day. Be aware of how much alcohol is in your drink. In the [place] one drink equals one 12 oz bottle of beer (355 mL), one 5 oz glass of wine (148 mL), or one 1 oz glass of hard liquor (44 mL). General instructions Schedule regular health, dental, and eye exams. Stay current with your vaccines. Tell your health care provider if: You often feel depressed. You have ever been abused or do not feel safe at home. Summary Adopting a healthy lifestyle and getting preventive care are important in promoting health and wellness. Follow your health care provider's instructions about healthy diet, exercising, and getting tested or screened for diseases. Follow your health care provider's instructions on monitoring your cholesterol and blood pressure. This information is not intended to replace advice given to you by your health care provider. Make sure you discuss any questions you have with your healthcare provider. Document Revised: 09/19/2018 Document Reviewed: 09/19/2018 Elsevier Patient Education  2022 Elsevier Inc.  

## 2021-05-03 NOTE — Progress Notes (Signed)
Subjective:  Patient ID: Tanner Thompson, male    DOB: 08-02-1951  Age: 70 y.o. MRN: [id number]  CC: Annual Exam  This visit occurred during the SARS-CoV-2 public health emergency.  Safety protocols were in place, including screening questions prior to the visit, additional usage of staff PPE, and extensive cleaning of exam room while observing appropriate contact time as indicated for disinfecting solutions.    HPI Tanner Thompson presents for a CPX and f/up -  For several months he has had rare episodes of dizziness with positional changes.  He recently saw an ENT doctor and was told that his inner ear is fine.  He does not have dizziness with activity.  He is active and denies palpitations, presyncope, lightheadedness, chest pain, shortness of breath, edema, or diaphoresis.  Outpatient Medications Prior to Visit  Medication Sig Dispense Refill   BLACK CURRANT SEED OIL PO Take by mouth.     brimonidine-timolol (COMBIGAN) 0.2-0.5 % ophthalmic solution Place 1 drop into both eyes every 12 (twelve) hours.     cholecalciferol (VITAMIN D3) 25 MCG (1000 UT) tablet Take 1,000 Units by mouth daily.     Coenzyme Q10 (CO Q 10 PO) Take 1 tablet by mouth as needed.     Ferrous Sulfate (IRON PO) Take 1 tablet by mouth as needed.     MAGNESIUM PO Take 1 tablet by mouth as needed.      NON FORMULARY Bilbery Extract     NON FORMULARY Boswella Complex     Omega 3-6-9 Fatty Acids (OMEGA-3-6-9 PO) Take 1 tablet by mouth daily.     traMADol (ULTRAM) 50 MG tablet Take 1 tablet (50 mg total) by mouth every 6 (six) hours as needed. 30 tablet 0   No facility-administered medications prior to visit.    ROS Review of Systems  Constitutional:  Negative for diaphoresis, fatigue and unexpected weight change.  HENT: Negative.    Eyes: Negative.   Respiratory:  Negative for cough, chest tightness, shortness of breath and wheezing.   Cardiovascular:  Negative for chest pain, palpitations and leg swelling.   Gastrointestinal:  Negative for abdominal pain, constipation, diarrhea, nausea and vomiting.  Endocrine: Negative.   Genitourinary: Negative.  Negative for difficulty urinating.  Musculoskeletal: Negative.  Negative for arthralgias.  Skin: Negative.   Neurological:  Positive for dizziness. Negative for weakness, light-headedness and headaches.  Hematological:  Negative for adenopathy. Does not bruise/bleed easily.  Psychiatric/Behavioral: Negative.     Objective:  BP 116/68 (BP Location: Right Arm, Patient Position: Sitting, Cuff Size: Large)   Pulse (!) 58   Temp 98.5 F (36.9 C) (Oral)   Resp 16   Ht 5\' 7"  (1.702 m)   Wt 163 lb (73.9 kg)   SpO2 98%   BMI 25.53 kg/m   BP Readings from Last 3 Encounters:  05/03/21 116/68  03/01/21 120/68  02/24/21 108/70    Wt Readings from Last 3 Encounters:  05/03/21 163 lb (73.9 kg)  03/01/21 167 lb (75.8 kg)  02/24/21 164 lb (74.4 kg)    Physical Exam Vitals reviewed.  Constitutional:      Appearance: Normal appearance.  HENT:     Nose: Nose normal.     Mouth/Throat:     Pharynx: Oropharynx is clear.  Eyes:     General: No scleral icterus.    Conjunctiva/sclera: Conjunctivae normal.  Cardiovascular:     Rate and Rhythm: Regular rhythm. Bradycardia present.     Heart sounds: Normal heart sounds,  S1 normal and S2 normal. No murmur heard.    Comments: EKG - Sinus bradycardia Otherwise normal EKG Pulmonary:     Effort: Pulmonary effort is normal.     Breath sounds: No stridor. No wheezing, rhonchi or rales.  Abdominal:     General: Abdomen is flat. Bowel sounds are normal. There is no distension.     Palpations: Abdomen is soft. There is no hepatomegaly, splenomegaly or mass.     Tenderness: There is no abdominal tenderness. There is no guarding.     Hernia: No hernia is present. There is no hernia in the left inguinal area or right inguinal area.  Genitourinary:    Pubic Area: No rash.      Penis: Normal and  uncircumcised. No swelling or lesions.      Testes: Normal.     Epididymis:     Right: Normal.     Left: Normal.     Prostate: Enlarged (1+ smooth symm BPH). Not tender and no nodules present.     Rectum: Normal. Guaiac result negative. No mass, tenderness, anal fissure, external hemorrhoid or internal hemorrhoid. Normal anal tone.  Musculoskeletal:        General: Normal range of motion.     Cervical back: No tenderness.     Right lower leg: No edema.     Left lower leg: No edema.  Lymphadenopathy:     Lower Body: No right inguinal adenopathy. No left inguinal adenopathy.  Skin:    General: Skin is warm and dry.     Findings: No rash.  Neurological:     General: No focal deficit present.     Mental Status: He is alert.    Lab Results  Component Value Date   WBC 3.2 (L) 05/03/2021   HGB 12.4 (L) 05/03/2021   HCT 38.1 (L) 05/03/2021   PLT 154.0 05/03/2021   GLUCOSE 102 (H) 05/03/2021   CHOL 175 05/03/2021   TRIG 54.0 05/03/2021   HDL 88.80 05/03/2021   LDLCALC 75 05/03/2021   ALT 14 05/03/2021   AST 18 05/03/2021   NA 137 05/03/2021   K 4.7 05/03/2021   CL 105 05/03/2021   CREATININE 1.18 05/03/2021   BUN 17 05/03/2021   CO2 27 05/03/2021   TSH 1.23 05/03/2021   PSA 0.51 05/03/2021    MR BRAIN W WO CONTRAST  Result Date: 08/15/2018  [facility] [address] [place], [place] [address] [phone] NEUROIMAGING REPORT STUDY DATE: 08/14/2018 PATIENT NAME: Tanner Thompson DOB: December 20, 1950 MRN: [id number] ORDERING CLINICIAN:Dr Willis CLINICAL HISTORY:  67 year patient with abnormal MRI COMPARISON FILMS: MRI Brain wo 05/19/2017 EXAM: MRI Brain w/wo TECHNIQUE: MRI of the brain with and without contrast was obtained utilizing 5 mm axial slices with T1, T2, T2 flair, T2 star gradient echo and diffusion weighted views.  T1 sagittal, T2 coronal and postcontrast views in the axial and coronal plane were obtained. CONTRAST:15 ml iv multihance IMAGING SITE:  [facility] FINDINGS: The brain parenchyma shows persistent mild hyperintensity on T2-weighted and FLAIR images within the posterior lower midbrain and upper pons. When compared to the MRI dated 05/19/17, edges are unchanged in appearance.  No other structural lesion, tumor or infarcts are noted.No abnormal lesions are seen on diffusion-weighted views to suggest acute ischemia. The cortical sulci, fissures and cisterns are normal in size and appearance. Lateral, third and fourth ventricle are normal in size and appearance. No extra-axial fluid collections are seen. No evidence of  mass effect or midline shift.  No abnormal lesions are seen on post contrast views.  On sagittal views the posterior fossa, pituitary gland and corpus callosum are unremarkable. No evidence of intracranial hemorrhage on gradient-echo views. The orbits and their contents,  and calvarium are unremarkable.  Intracranial flow voids are present.  The paranasal sinuses show chronic inflammatory changes.   Abnormal MRI scan of the brain showing persistent mild T2/flair hyperintensity in the posterior lower midbrain and upper pons which appears to be unchanged compared with previous MRI from 05/19/2017.  No enhancing lesions are noted.  There also appears to be stable changes of chronic paranasal sinusitis. INTERPRETING PHYSICIAN: Delia Heady, MD Certified in  Neuroimaging by American Society of Neuroimaging and [employer]    Assessment & Plan:   Tanner Thompson was seen today for annual exam.  Diagnoses and all orders for this visit:  Encounter for general adult medical examination with abnormal findings- Exam completed, labs reviewed, vaccines are up-to-date, cancer screenings are up-to-date, patient education was given.  Bradycardia- He has rare positional dizziness but no symptoms with exertion.  I do not think he is symptomatic from the bradycardia.  Labs are negative for secondary causes.  Will  continue to monitor. -     TSH; Future -     EKG 12-Lead -     TSH  Stage 3a chronic kidney disease (HCC)- He is mildly anemic.  I have asked him to return to be screened for vitamin deficiencies.  His renal function is stable and his blood pressure is well controlled. -     CBC with Differential/Platelet; Future -     Basic metabolic panel; Future -     Urinalysis, Routine w reflex microscopic; Future -     Urinalysis, Routine w reflex microscopic -     Basic metabolic panel -     CBC with Differential/Platelet  Hyperlipidemia LDL goal <130- Statin therapy is not indicated. -     Lipid panel; Future -     Hepatic function panel; Future -     Hepatic function panel -     Lipid panel  Benign prostatic hyperplasia without lower urinary tract symptoms- He has no symptoms with this and his PSA is reassuring. -     Urinalysis, Routine w reflex microscopic; Future -     PSA; Future -     PSA -     Urinalysis, Routine w reflex microscopic  I am having Tanner Thompson [employer]" maintain his brimonidine-timolol, cholecalciferol, BLACK CURRANT SEED OIL PO, NON FORMULARY, Omega 3-6-9 Fatty Acids (OMEGA-3-6-9 PO), NON FORMULARY, MAGNESIUM PO, Ferrous Sulfate (IRON PO), Coenzyme Q10 (CO Q 10 PO), and traMADol.  No orders of the defined types were placed in this encounter.    Follow-up: Return in about 6 months (around 11/03/2021).  Sanda Linger, MD

## 2021-05-18 DIAGNOSIS — H401131 Primary open-angle glaucoma, bilateral, mild stage: Secondary | ICD-10-CM | POA: Diagnosis not present

## 2021-05-31 ENCOUNTER — Telehealth: Payer: Self-pay | Admitting: Internal Medicine

## 2021-05-31 ENCOUNTER — Encounter: Payer: Self-pay | Admitting: Internal Medicine

## 2021-05-31 NOTE — Telephone Encounter (Signed)
Barnetta Chapel with [employer] [phone] ext. [phone]  Hours 7:00 to 4:00 pm [place] time M-F  Clydie Braun needs to discuss Mr. Rosengrant visits on 5.18.22 re: ear wax removal and subsequent events after that time  Did the patient sign a consent?

## 2021-06-01 NOTE — Telephone Encounter (Signed)
Called again. Stated she never received a callback yesterday. Requesting a callback as soon as possible due to it being time sensitive.   She at lunch from 12-1   [phone] ext. [phone]

## 2021-06-02 NOTE — Telephone Encounter (Signed)
Correct fax number [phone]. Info faxed to number requested.

## 2021-06-02 NOTE — Telephone Encounter (Signed)
OV 02/24/21 & 03/01/21 discussed with Clydie Braun; discussed mychart messages, phone encounters, & referrals; all faxed to [phone] as requested for review.

## 2021-06-03 ENCOUNTER — Other Ambulatory Visit: Payer: Self-pay | Admitting: Internal Medicine

## 2021-06-03 DIAGNOSIS — D539 Nutritional anemia, unspecified: Secondary | ICD-10-CM | POA: Insufficient documentation

## 2021-06-16 ENCOUNTER — Other Ambulatory Visit: Payer: Self-pay

## 2021-06-16 ENCOUNTER — Ambulatory Visit (INDEPENDENT_AMBULATORY_CARE_PROVIDER_SITE_OTHER): Payer: Medicare HMO

## 2021-06-16 ENCOUNTER — Encounter: Payer: Self-pay | Admitting: Nurse Practitioner

## 2021-06-16 DIAGNOSIS — Z23 Encounter for immunization: Secondary | ICD-10-CM

## 2021-06-16 NOTE — Progress Notes (Signed)
After obtaining consent, and per orders of Tanner Thompson, injection of influenza given by Eriyana Sweeten. Patient instructed to remain in clinic for 20 minutes afterwards, and to report any adverse reaction to me immediately.  

## 2021-06-17 ENCOUNTER — Ambulatory Visit (INDEPENDENT_AMBULATORY_CARE_PROVIDER_SITE_OTHER): Payer: Medicare HMO

## 2021-06-17 ENCOUNTER — Encounter: Payer: Self-pay | Admitting: Internal Medicine

## 2021-06-17 VITALS — BP 102/70 | HR 65 | Temp 98.2°F | Resp 16 | Ht 67.0 in | Wt 167.8 lb

## 2021-06-17 DIAGNOSIS — Z0000 Encounter for general adult medical examination without abnormal findings: Secondary | ICD-10-CM | POA: Diagnosis not present

## 2021-06-17 NOTE — Patient Instructions (Signed)
[name] , Thank you for taking time to come for your Medicare Wellness Visit. I appreciate your ongoing commitment to your health goals. Please review the following plan we discussed and let me know if I can assist you in the future.   Screening recommendations/referrals: Colonoscopy: 03/14/2013; due every 10 years Recommended yearly ophthalmology/optometry visit for glaucoma screening and checkup Recommended yearly dental visit for hygiene and checkup  Vaccinations: Influenza vaccine: 06/16/2021 Pneumococcal vaccine: 08/13/2015, 02/20/2017 Tdap vaccine: 08/17/2017; due every 10 years Shingles vaccine: 06/05/2017, 09/04/2017   Covid-19: 11/14/2019, 12/05/2019  Advanced directives: Advance directive discussed with you today. I have provided a copy for you to complete at home and have notarized. Once this is complete please bring a copy in to our office so we can scan it into your chart.  Conditions/risks identified: Yes; Client understands the importance of follow-up with providers by attending scheduled visits and discussed goals to eat healthier, increase physical activity, exercise the brain, socialize more, get enough sleep and make time for laughter.  Next appointment: Please schedule your next Medicare Wellness Visit with your Nurse Health Advisor in 1 year by calling [phone].  Preventive Care 71 Years and Older, Male Preventive care refers to lifestyle choices and visits with your health care provider that can promote health and wellness. What does preventive care include? A yearly physical exam. This is also called an annual well check. Dental exams once or twice a year. Routine eye exams. Ask your health care provider how often you should have your eyes checked. Personal lifestyle choices, including: Daily care of your teeth and gums. Regular physical activity. Eating a healthy diet. Avoiding tobacco and drug use. Limiting alcohol use. Practicing safe sex. Taking low doses of  aspirin every day. Taking vitamin and mineral supplements as recommended by your health care provider. What happens during an annual well check? The services and screenings done by your health care provider during your annual well check will depend on your age, overall health, lifestyle risk factors, and family history of disease. Counseling  Your health care provider may ask you questions about your: Alcohol use. Tobacco use. Drug use. Emotional well-being. Home and relationship well-being. Sexual activity. Eating habits. History of falls. Memory and ability to understand (cognition). Work and work Astronomer. Screening  You may have the following tests or measurements: Height, weight, and BMI. Blood pressure. Lipid and cholesterol levels. These may be checked every 5 years, or more frequently if you are over 46 years old. Skin check. Lung cancer screening. You may have this screening every year starting at age 37 if you have a 30-pack-year history of smoking and currently smoke or have quit within the past 15 years. Fecal occult blood test (FOBT) of the stool. You may have this test every year starting at age 6. Flexible sigmoidoscopy or colonoscopy. You may have a sigmoidoscopy every 5 years or a colonoscopy every 10 years starting at age 59. Prostate cancer screening. Recommendations will vary depending on your family history and other risks. Hepatitis C blood test. Hepatitis B blood test. Sexually transmitted disease (STD) testing. Diabetes screening. This is done by checking your blood sugar (glucose) after you have not eaten for a while (fasting). You may have this done every 1-3 years. Abdominal aortic aneurysm (AAA) screening. You may need this if you are a current or former smoker. Osteoporosis. You may be screened starting at age 104 if you are at high risk. Talk with your health care provider about your test  results, treatment options, and if necessary, the need for more  tests. Vaccines  Your health care provider may recommend certain vaccines, such as: Influenza vaccine. This is recommended every year. Tetanus, diphtheria, and acellular pertussis (Tdap, Td) vaccine. You may need a Td booster every 10 years. Zoster vaccine. You may need this after age 34. Pneumococcal 13-valent conjugate (PCV13) vaccine. One dose is recommended after age 1. Pneumococcal polysaccharide (PPSV23) vaccine. One dose is recommended after age 67. Talk to your health care provider about which screenings and vaccines you need and how often you need them. This information is not intended to replace advice given to you by your health care provider. Make sure you discuss any questions you have with your health care provider. Document Released: 10/23/2015 Document Revised: 06/15/2016 Document Reviewed: 07/28/2015 Elsevier Interactive Patient Education  2017 [employer].  Fall Prevention in the Home Falls can cause injuries. They can happen to people of all ages. There are many things you can do to make your home safe and to help prevent falls. What can I do on the outside of my home? Regularly fix the edges of walkways and driveways and fix any cracks. Remove anything that might make you trip as you walk through a door, such as a raised step or threshold. Trim any bushes or trees on the path to your home. Use bright outdoor lighting. Clear any walking paths of anything that might make someone trip, such as rocks or tools. Regularly check to see if handrails are loose or broken. Make sure that both sides of any steps have handrails. Any raised decks and porches should have guardrails on the edges. Have any leaves, snow, or ice cleared regularly. Use sand or salt on walking paths during winter. Clean up any spills in your garage right away. This includes oil or grease spills. What can I do in the bathroom? Use night lights. Install grab bars by the toilet and in the tub and shower.  Do not use towel bars as grab bars. Use non-skid mats or decals in the tub or shower. If you need to sit down in the shower, use a plastic, non-slip stool. Keep the floor dry. Clean up any water that spills on the floor as soon as it happens. Remove soap buildup in the tub or shower regularly. Attach bath mats securely with double-sided non-slip rug tape. Do not have throw rugs and other things on the floor that can make you trip. What can I do in the bedroom? Use night lights. Make sure that you have a light by your bed that is easy to reach. Do not use any sheets or blankets that are too big for your bed. They should not hang down onto the floor. Have a firm chair that has side arms. You can use this for support while you get dressed. Do not have throw rugs and other things on the floor that can make you trip. What can I do in the kitchen? Clean up any spills right away. Avoid walking on wet floors. Keep items that you use a lot in easy-to-reach places. If you need to reach something above you, use a strong step stool that has a grab bar. Keep electrical cords out of the way. Do not use floor polish or wax that makes floors slippery. If you must use wax, use non-skid floor wax. Do not have throw rugs and other things on the floor that can make you trip. What can I do with my stairs?  Do not leave any items on the stairs. Make sure that there are handrails on both sides of the stairs and use them. Fix handrails that are broken or loose. Make sure that handrails are as long as the stairways. Check any carpeting to make sure that it is firmly attached to the stairs. Fix any carpet that is loose or worn. Avoid having throw rugs at the top or bottom of the stairs. If you do have throw rugs, attach them to the floor with carpet tape. Make sure that you have a light switch at the top of the stairs and the bottom of the stairs. If you do not have them, ask someone to add them for you. What else  can I do to help prevent falls? Wear shoes that: Do not have high heels. Have rubber bottoms. Are comfortable and fit you well. Are closed at the toe. Do not wear sandals. If you use a stepladder: Make sure that it is fully opened. Do not climb a closed stepladder. Make sure that both sides of the stepladder are locked into place. Ask someone to hold it for you, if possible. Clearly mark and make sure that you can see: Any grab bars or handrails. First and last steps. Where the edge of each step is. Use tools that help you move around (mobility aids) if they are needed. These include: Canes. Walkers. Scooters. Crutches. Turn on the lights when you go into a dark area. Replace any light bulbs as soon as they burn out. Set up your furniture so you have a clear path. Avoid moving your furniture around. If any of your floors are uneven, fix them. If there are any pets around you, be aware of where they are. Review your medicines with your doctor. Some medicines can make you feel dizzy. This can increase your chance of falling. Ask your doctor what other things that you can do to help prevent falls. This information is not intended to replace advice given to you by your health care provider. Make sure you discuss any questions you have with your health care provider. Document Released: 07/23/2009 Document Revised: 03/03/2016 Document Reviewed: 10/31/2014 Elsevier Interactive Patient Education  2017 [employer].

## 2021-06-17 NOTE — Progress Notes (Signed)
Subjective:   Tanner Thompson is a 70 y.o. male who presents for Medicare Annual/Subsequent preventive examination.  Review of Systems     Cardiac Risk Factors include: advanced age (>46mn, >>42women);dyslipidemia;male gender     Objective:    Today's Vitals   06/17/21 1254  BP: 102/70  Pulse: 65  Resp: 16  Temp: 98.2 F (36.8 C)  SpO2: 99%  Weight: 167 lb 12.8 oz (76.1 kg)  Height: 5' 7" (1.702 m)  PainSc: 0-No pain   Body mass index is 26.28 kg/m.  Advanced Directives 06/17/2021 04/09/2019 04/06/2018 12/23/2016  Does Patient Have a Medical Advance Directive? No No No No  Would patient like information on creating a medical advance directive? Yes (MAU/Ambulatory/Procedural Areas - Information given) Yes (ED - Information included in AVS) Yes (ED - Information included in AVS) Yes (ED - Information included in AVS)    Current Medications (verified) Outpatient Encounter Medications as of 06/17/2021  Medication Sig   BLACK CURRANT SEED OIL PO Take by mouth.   brimonidine-timolol (COMBIGAN) 0.2-0.5 % ophthalmic solution Place 1 drop into both eyes every 12 (twelve) hours.   cholecalciferol (VITAMIN D3) 25 MCG (1000 UT) tablet Take 1,000 Units by mouth daily.   Coenzyme Q10 (CO Q 10 PO) Take 1 tablet by mouth as needed.   Ferrous Sulfate (IRON PO) Take 1 tablet by mouth as needed.   MAGNESIUM PO Take 1 tablet by mouth as needed.    NON FORMULARY Bilbery Extract   NON FORMULARY Boswella Complex   Omega 3-6-9 Fatty Acids (OMEGA-3-6-9 PO) Take 1 tablet by mouth daily.   traMADol (ULTRAM) 50 MG tablet Take 1 tablet (50 mg total) by mouth every 6 (six) hours as needed. (Patient not taking: Reported on 06/17/2021)   No facility-administered encounter medications on file as of 06/17/2021.    Allergies (verified) Patient has no known allergies.   History: Past Medical History:  Diagnosis Date   Diplopia    Glaucoma    [place]top[place] ([place] 12/[place]   Past Surgical History:   Procedure Laterality Date   CATARACT EXTRACTION Bilateral    One eye laser removal, other removed about 15 yr ago   HEMORROIDECTOMY     Family History  Problem Relation Age of Onset   Breast cancer Mother    Prostate cancer Father    Social History   Socioeconomic History   Marital status: Married    Spouse name: BHassan Thompson  Number of children: 2   Years of education: 12   Highest education level: Not on file  Occupational History   Occupation: Retired - BRecruitment consultant  Tobacco Use   Smoking status: Never   Smokeless tobacco: Never  VScientific laboratory technicianUse: Never used  Substance and Sexual Activity   Alcohol use: Yes    Comment: Occasional   Drug use: No   Sexual activity: Yes  Other Topics Concern   Not on file  Social History Narrative   Lives with spouse   Fun: Reading, art, physical activity   Social Determinants of Health   Financial Resource Strain: Low Risk    Difficulty of Paying Living Expenses: Not hard at all  Food Insecurity: No Food Insecurity   Worried About RCharity fundraiserin the Last Year: Never true   REast Morichesin the Last Year: Never true  Transportation Needs: No Transportation Needs   Lack of Transportation (Medical): No   Lack of Transportation (Non-Medical):  No  Physical Activity: Sufficiently Active   Days of Exercise per Week: 7 days   Minutes of Exercise per Session: 60 min  Stress: No Stress Concern Present   Feeling of Stress : Not at all  Social Connections: Socially Integrated   Frequency of Communication with Friends and Family: More than three times a week   Frequency of Social Gatherings with Friends and Family: More than three times a week   Attends Religious Services: More than 4 times per year   Active Member of [employer] or Organizations: Yes   Attends Music therapist: More than 4 times per year   Marital Status: Married    Tobacco Counseling Counseling given: Not Answered   Clinical  Intake:  Pre-visit preparation completed: Yes  Pain : No/denies pain Pain Score: 0-No pain     BMI - recorded: 26.28 Nutritional Status: BMI 25 -29 Overweight Nutritional Risks: None Diabetes: No  How often do you need to have someone help you when you read instructions, pamphlets, or other written materials from your doctor or pharmacy?: 1 - Never What is the last grade level you completed in school?: High School Graduate  Diabetic? no  Interpreter Needed?: No  Information entered by :: Tanner Abu, LPN   Activities of Daily Living In your present state of health, do you have any difficulty performing the following activities: 06/17/2021 02/24/2021  Hearing? N N  Vision? N N  Difficulty concentrating or making decisions? N N  Walking or climbing stairs? N N  Dressing or bathing? N N  Doing errands, shopping? N N  Preparing Food and eating ? N -  Using the Toilet? N -  In the past six months, have you accidently leaked urine? N -  Do you have problems with loss of bowel control? N -  Managing your Medications? N -  Managing your Finances? N -  Housekeeping or managing your Housekeeping? N -  Some recent data might be hidden    Patient Care Team: Tanner Lima, MD as PCP - General (Internal Medicine) Tanner Ducking, MD as Consulting Physician (Neurology) Tanner Planas, MD as Consulting Physician (Orthopedic Surgery) Tanner Slim Neena Rhymes, MD as Consulting Physician (Ophthalmology)  Indicate any recent Medical Services you may have received from other than [facility] providers in the past year (date may be approximate).     Assessment:   This is a routine wellness examination for Tanner Thompson.  Hearing/Vision screen No results found.  Dietary issues and exercise activities discussed: Current Exercise Habits: Home exercise routine;Structured exercise class, Type of exercise: Other - see comments (Zumba, Tai-Chi classes, walk 2-3 miles everyday with wife), Time  (Minutes): 60, Frequency (Times/Week): 7, Weekly Exercise (Minutes/Week): 420, Intensity: Moderate, Exercise limited by: None identified   Goals Addressed               This Visit's Progress     Patient Stated (pt-stated)        My goal is to lose 5-10 pounds by watching my diet and being physically active.      Depression Screen PHQ 2/9 Scores 06/17/2021 05/03/2021 04/09/2020 04/09/2019 11/21/2018 04/06/2018 02/20/2017  PHQ - 2 Score 0 0 0 0 4 0 0  PHQ- 9 Score - - - - 10 - -    Fall Risk Fall Risk  06/17/2021 05/03/2021 04/09/2020 04/09/2019 11/21/2018  Falls in the past year? 0 0 0 0 0  Number falls in past yr: 0 - 0 0 0  Injury  with Fall? 0 - 0 - 0  Risk for fall due to : No Fall Risks - No Fall Risks - -  Follow up Falls evaluation completed - Falls evaluation completed - Falls evaluation completed    FALL RISK PREVENTION PERTAINING TO THE HOME:  Any stairs in or around the home? No  If so, are there any without handrails? No  Home free of loose throw rugs in walkways, pet beds, electrical cords, etc? Yes  Adequate lighting in your home to reduce risk of falls? Yes   ASSISTIVE DEVICES UTILIZED TO PREVENT FALLS:  Life alert? No  Use of a cane, walker or w/c? No  Grab bars in the bathroom? No  Shower chair or bench in shower? No  Elevated toilet seat or a handicapped toilet? Yes   TIMED UP AND GO:  Was the test performed? Yes .  Length of time to ambulate 10 feet: 5 sec.   Gait steady and fast without use of assistive device  Cognitive Function: Normal cognitive status assessed by direct observation by this Nurse Health Advisor. No abnormalities found.          Immunizations Immunization History  Administered Date(s) Administered   Fluad Quad(high Dose 65+) 06/08/2019, 06/16/2021   Influenza, High Dose Seasonal PF 07/19/2018   PFIZER(Purple Top)SARS-COV-2 Vaccination 11/14/2019, 12/05/2019   Pneumococcal Conjugate-13 02/20/2017   Pneumococcal Polysaccharide-23  08/13/2015   Tdap 08/17/2017   Zoster Recombinat (Shingrix) 06/05/2017, 09/04/2017    TDAP status: Up to date  Flu Vaccine status: Up to date  Pneumococcal vaccine status: Up to date  Covid-19 vaccine status: Completed vaccines  Qualifies for Shingles Vaccine? Yes   Zostavax completed No   Shingrix Completed?: Yes  Screening Tests Health Maintenance  Topic Date Due   COVID-19 Vaccine (3 - Booster for Pfizer series) 05/03/2020   COLONOSCOPY (Pts 45-78yr Insurance coverage will need to be confirmed)  03/15/2023   TETANUS/TDAP  08/18/2027   INFLUENZA VACCINE  Completed   Hepatitis C Screening  Completed   PNA vac Low Risk Adult  Completed   Zoster Vaccines- Shingrix  Completed   HPV VACCINES  Aged Out    Health Maintenance  Health Maintenance Due  Topic Date Due   COVID-19 Vaccine (3 - Booster for Pfizer series) 05/03/2020    Colorectal cancer screening: Type of screening: Colonoscopy. Completed 03/14/2013. Repeat every 10 years  Lung Cancer Screening: (Low Dose CT Chest recommended if Age 71-80years, 30 pack-year currently smoking OR have quit w/in 15years.) does not qualify.   Lung Cancer Screening Referral: no  Additional Screening:  Hepatitis C Screening: does qualify; Completed: yes  Vision Screening: Recommended annual ophthalmology exams for early detection of glaucoma and other disorders of the eye. Is the patient up to date with their annual eye exam?  Yes  Who is the provider or what is the name of the office in which the patient attends annual eye exams? Dr. AJulian ReilIf pt is not established with a provider, would they like to be referred to a provider to establish care? No .   Dental Screening: Recommended annual dental exams for proper oral hygiene  Community Resource Referral / Chronic Care Management: CRR required this visit?  No   CCM required this visit?  No      Plan:     I have personally reviewed and noted the following in the  patient's chart:   Medical and social history Use of alcohol, tobacco or illicit drugs  Current medications and supplements including opioid prescriptions. Patient is not currently taking opioid prescriptions. Functional ability and status Nutritional status Physical activity Advanced directives List of other physicians Hospitalizations, surgeries, and ER visits in previous 12 months Vitals Screenings to include cognitive, depression, and falls Referrals and appointments  In addition, I have reviewed and discussed with patient certain preventive protocols, quality metrics, and best practice recommendations. A written personalized care plan for preventive services as well as general preventive health recommendations were provided to patient.     Sheral Flow, LPN   03/12/7857   Nurse Notes:  Hearing Screening - Comments:: Patient denied any hearing difficulty.  No hearing aids. Vision Screening - Comments:: Patient wears glasses.  Eye exam done annually by Julian Reil, MD.

## 2021-07-30 ENCOUNTER — Encounter: Payer: Self-pay | Admitting: Internal Medicine

## 2021-11-14 ENCOUNTER — Encounter: Payer: Self-pay | Admitting: Internal Medicine

## 2021-11-22 DIAGNOSIS — H401131 Primary open-angle glaucoma, bilateral, mild stage: Secondary | ICD-10-CM | POA: Diagnosis not present

## 2021-12-01 ENCOUNTER — Ambulatory Visit (INDEPENDENT_AMBULATORY_CARE_PROVIDER_SITE_OTHER): Payer: Medicare HMO | Admitting: Internal Medicine

## 2021-12-01 ENCOUNTER — Other Ambulatory Visit: Payer: Self-pay

## 2021-12-01 ENCOUNTER — Encounter: Payer: Self-pay | Admitting: Internal Medicine

## 2021-12-01 ENCOUNTER — Ambulatory Visit (INDEPENDENT_AMBULATORY_CARE_PROVIDER_SITE_OTHER): Payer: Medicare HMO

## 2021-12-01 VITALS — BP 112/68 | HR 76 | Temp 97.8°F | Ht 67.0 in | Wt 163.0 lb

## 2021-12-01 DIAGNOSIS — D538 Other specified nutritional anemias: Secondary | ICD-10-CM | POA: Diagnosis not present

## 2021-12-01 DIAGNOSIS — R634 Abnormal weight loss: Secondary | ICD-10-CM | POA: Diagnosis not present

## 2021-12-01 DIAGNOSIS — R0782 Intercostal pain: Secondary | ICD-10-CM | POA: Insufficient documentation

## 2021-12-01 DIAGNOSIS — D539 Nutritional anemia, unspecified: Secondary | ICD-10-CM | POA: Diagnosis not present

## 2021-12-01 DIAGNOSIS — E519 Thiamine deficiency, unspecified: Secondary | ICD-10-CM

## 2021-12-01 DIAGNOSIS — R072 Precordial pain: Secondary | ICD-10-CM | POA: Diagnosis not present

## 2021-12-01 DIAGNOSIS — R079 Chest pain, unspecified: Secondary | ICD-10-CM | POA: Diagnosis not present

## 2021-12-01 LAB — CBC WITH DIFFERENTIAL/PLATELET
Basophils Absolute: 0 10*3/uL (ref 0.0–0.1)
Basophils Relative: 0.8 % (ref 0.0–3.0)
Eosinophils Absolute: 0.1 10*3/uL (ref 0.0–0.7)
Eosinophils Relative: 4.1 % (ref 0.0–5.0)
HCT: 38.3 % — ABNORMAL LOW (ref 39.0–52.0)
Hemoglobin: 12.6 g/dL — ABNORMAL LOW (ref 13.0–17.0)
Lymphocytes Relative: 44.6 % (ref 12.0–46.0)
Lymphs Abs: 1.3 10*3/uL (ref 0.7–4.0)
MCHC: 32.9 g/dL (ref 30.0–36.0)
MCV: 80.7 fl (ref 78.0–100.0)
Monocytes Absolute: 0.3 10*3/uL (ref 0.1–1.0)
Monocytes Relative: 11.9 % (ref 3.0–12.0)
Neutro Abs: 1.1 10*3/uL — ABNORMAL LOW (ref 1.4–7.7)
Neutrophils Relative %: 38.6 % — ABNORMAL LOW (ref 43.0–77.0)
Platelets: 167 10*3/uL (ref 150.0–400.0)
RBC: 4.75 Mil/uL (ref 4.22–5.81)
RDW: 13.8 % (ref 11.5–15.5)
WBC: 2.9 10*3/uL — ABNORMAL LOW (ref 4.0–10.5)

## 2021-12-01 LAB — IBC + FERRITIN
Ferritin: 200.1 ng/mL (ref 22.0–322.0)
Iron: 82 ug/dL (ref 42–165)
Saturation Ratios: 21.5 % (ref 20.0–50.0)
TIBC: 380.8 ug/dL (ref 250.0–450.0)
Transferrin: 272 mg/dL (ref 212.0–360.0)

## 2021-12-01 LAB — VITAMIN B12: Vitamin B-12: 476 pg/mL (ref 211–911)

## 2021-12-01 LAB — TROPONIN I (HIGH SENSITIVITY): High Sens Troponin I: 7 ng/L (ref 2–17)

## 2021-12-01 LAB — FOLATE: Folate: 16.9 ng/mL (ref >5.9)

## 2021-12-01 NOTE — Patient Instructions (Signed)
Anemia °Anemia is a condition in which there is not enough red blood cells or hemoglobin in the blood. Hemoglobin is a substance in red blood cells that carries oxygen. °When you do not have enough red blood cells or hemoglobin (are anemic), your body cannot get enough oxygen and your organs may not work properly. As a result, you may feel very tired or have other problems. °What are the causes? °Common causes of anemia include: °Excessive bleeding. Anemia can be caused by excessive bleeding inside or outside the body, including bleeding from the intestines or from heavy menstrual periods in females. °Poor nutrition. °Long-lasting (chronic) kidney, thyroid, and liver disease. °Bone marrow disorders, spleen problems, and blood disorders. °Cancer and treatments for cancer. °HIV (human immunodeficiency virus) and AIDS (acquired immunodeficiency syndrome). °Infections, medicines, and autoimmune disorders that destroy red blood cells. °What are the signs or symptoms? °Symptoms of this condition include: °Minor weakness. °Dizziness. °Headache, or difficulties concentrating and sleeping. °Heartbeats that feel irregular or faster than normal (palpitations). °Shortness of breath, especially with exercise. °Pale skin, lips, and nails, or cold hands and feet. °Indigestion and nausea. °Symptoms may occur suddenly or develop slowly. If your anemia is mild, you may not have symptoms. °How is this diagnosed? °This condition is diagnosed based on blood tests, your medical history, and a physical exam. In some cases, a test may be needed in which cells are removed from the soft tissue inside of a bone and looked at under a microscope (bone marrow biopsy). Your health care provider may also check your stool (feces) for blood and may do additional testing to look for the cause of your bleeding. °Other tests may include: °Imaging tests, such as a CT scan or MRI. °A procedure to see inside your esophagus and stomach (endoscopy). °A  procedure to see inside your colon and rectum (colonoscopy). °How is this treated? °Treatment for this condition depends on the cause. If you continue to lose a lot of blood, you may need to be treated at a hospital. Treatment may include: °Taking supplements of iron, vitamin B12, or folic acid. °Taking a hormone medicine (erythropoietin) that can help to stimulate red blood cell growth. °Having a blood transfusion. This may be needed if you lose a lot of blood. °Making changes to your diet. °Having surgery to remove your spleen. °Follow these instructions at home: °Take over-the-counter and prescription medicines only as told by your health care provider. °Take supplements only as told by your health care provider. °Follow any diet instructions that you were given by your health care provider. °Keep all follow-up visits as told by your health care provider. This is important. °Contact a health care provider if: °You develop new bleeding anywhere in the body. °Get help right away if: °You are very weak. °You are short of breath. °You have pain in your abdomen or chest. °You are dizzy or feel faint. °You have trouble concentrating. °You have bloody stools, black stools, or tarry stools. °You vomit repeatedly or you vomit up blood. °These symptoms may represent a serious problem that is an emergency. Do not wait to see if the symptoms will go away. Get medical help right away. Call your local emergency services (911 in the [place] Do not drive yourself to the hospital. °Summary °Anemia is a condition in which you do not have enough red blood cells or enough of a substance in your red blood cells that carries oxygen (hemoglobin). °Symptoms may occur suddenly or develop slowly. °If your anemia is   mild, you may not have symptoms. °This condition is diagnosed with blood tests, a medical history, and a physical exam. Other tests may be needed. °Treatment for this condition depends on the cause of the anemia. °This  information is not intended to replace advice given to you by your health care provider. Make sure you discuss any questions you have with your health care provider. °Document Revised: 09/03/2019 Document Reviewed: 09/03/2019 °Elsevier Patient Education © 2022 [employer] ° °

## 2021-12-01 NOTE — Progress Notes (Signed)
Subjective:  Patient ID: Tanner Thompson, male    DOB: [birth year]  Age: 71 y.o. MRN: [id number]  CC: Anemia  This visit occurred during the SARS-CoV-2 public health emergency.  Safety protocols were in place, including screening questions prior to the visit, additional usage of staff PPE, and extensive cleaning of exam room while observing appropriate contact time as indicated for disinfecting solutions.    HPI Tanner Thompson presents for f/up -   He complains of a 1 month history of chest pain (dull sensation at rest) under his mid sternum with weight loss.  He exercises and does not experience dyspnea on exertion, diaphoresis, palpitations, or edema.  He complains of orthostatic dizziness.  Outpatient Medications Prior to Visit  Medication Sig Dispense Refill   BLACK CURRANT SEED OIL PO Take by mouth.     brimonidine-timolol (COMBIGAN) 0.2-0.5 % ophthalmic solution Place 1 drop into both eyes every 12 (twelve) hours.     cholecalciferol (VITAMIN D3) 25 MCG (1000 UT) tablet Take 1,000 Units by mouth daily.     Coenzyme Q10 (CO Q 10 PO) Take 1 tablet by mouth as needed.     Ferrous Sulfate (IRON PO) Take 1 tablet by mouth as needed.     MAGNESIUM PO Take 1 tablet by mouth as needed.      NON FORMULARY Bilbery Extract     NON FORMULARY Boswella Complex     Omega 3-6-9 Fatty Acids (OMEGA-3-6-9 PO) Take 1 tablet by mouth daily.     traMADol (ULTRAM) 50 MG tablet Take 1 tablet (50 mg total) by mouth every 6 (six) hours as needed. 30 tablet 0   No facility-administered medications prior to visit.    ROS Review of Systems  Constitutional:  Positive for unexpected weight change. Negative for appetite change, chills, diaphoresis, fatigue and fever.  HENT:  Negative for sore throat and trouble swallowing.   Respiratory:  Negative for cough, chest tightness, shortness of breath and wheezing.   Cardiovascular:  Positive for chest pain. Negative for palpitations and leg swelling.   Gastrointestinal:  Negative for abdominal pain, constipation, diarrhea and vomiting.  Genitourinary:  Negative for difficulty urinating, dysuria and hematuria.  Musculoskeletal: Negative.   Skin: Negative.   Neurological:  Positive for dizziness and light-headedness. Negative for weakness and numbness.  Hematological:  Negative for adenopathy. Does not bruise/bleed easily.  Psychiatric/Behavioral: Negative.     Objective:  BP 112/68 (BP Location: Right Arm, Patient Position: Sitting, Cuff Size: Large)    Pulse 76    Temp 97.8 F (36.6 C) (Oral)    Ht 5\' 7"  (1.702 m)    Wt 163 lb (73.9 kg)    SpO2 98%    BMI 25.53 kg/m   BP Readings from Last 3 Encounters:  12/01/21 112/68  06/17/21 102/70  05/03/21 116/68    Wt Readings from Last 3 Encounters:  12/01/21 163 lb (73.9 kg)  06/17/21 167 lb 12.8 oz (76.1 kg)  05/03/21 163 lb (73.9 kg)    Physical Exam Vitals reviewed.  Constitutional:      Appearance: Normal appearance.  HENT:     Nose: Nose normal.     Mouth/Throat:     Mouth: Mucous membranes are moist.  Eyes:     General: No scleral icterus.    Conjunctiva/sclera: Conjunctivae normal.  Cardiovascular:     Rate and Rhythm: Regular rhythm. Bradycardia present.     Heart sounds: Normal heart sounds, S1 normal and S2 normal. No murmur  heard.   No friction rub. No gallop.     Comments: EKG- SB, 58 bpm Otherwise normal EKG Pulmonary:     Effort: Pulmonary effort is normal.     Breath sounds: No stridor. No wheezing, rhonchi or rales.  Abdominal:     General: Abdomen is flat.     Palpations: There is no mass.     Tenderness: There is no abdominal tenderness. There is no guarding.     Hernia: No hernia is present.  Musculoskeletal:     Cervical back: Neck supple.     Right lower leg: No edema.     Left lower leg: No edema.  Lymphadenopathy:     Cervical: No cervical adenopathy.  Skin:    General: Skin is warm and dry.     Coloration: Skin is not pale.   Neurological:     General: No focal deficit present.     Mental Status: He is alert.  Psychiatric:        Mood and Affect: Mood normal.        Behavior: Behavior normal.    Lab Results  Component Value Date   WBC 2.9 (L) 12/01/2021   HGB 13.0 (L) 12/01/2021   HGB 12.6 (L) 12/01/2021   HCT 39.3 12/01/2021   HCT 38.3 (L) 12/01/2021   PLT 167.0 12/01/2021   GLUCOSE 102 (H) 05/03/2021   CHOL 175 05/03/2021   TRIG 54.0 05/03/2021   HDL 88.80 05/03/2021   LDLCALC 75 05/03/2021   ALT 14 05/03/2021   AST 18 05/03/2021   NA 137 05/03/2021   K 4.7 05/03/2021   CL 105 05/03/2021   CREATININE 1.18 05/03/2021   BUN 17 05/03/2021   CO2 27 05/03/2021   TSH 1.23 05/03/2021   PSA 0.51 05/03/2021    MR BRAIN W WO CONTRAST  Result Date: 08/15/2018  [facility] [address], [place], [place] [address] [phone] NEUROIMAGING REPORT STUDY DATE: 08/14/2018 PATIENT NAME: Tanner Thompson DOB: Mar 18, 1951 MRN: [id number] ORDERING CLINICIAN:Dr Willis CLINICAL HISTORY:  67 year patient with abnormal MRI COMPARISON FILMS: MRI Brain wo 05/19/2017 EXAM: MRI Brain w/wo TECHNIQUE: MRI of the brain with and without contrast was obtained utilizing 5 mm axial slices with T1, T2, T2 flair, T2 star gradient echo and diffusion weighted views.  T1 sagittal, T2 coronal and postcontrast views in the axial and coronal plane were obtained. CONTRAST:15 ml iv multihance IMAGING SITE: [facility] FINDINGS: The brain parenchyma shows persistent mild hyperintensity on T2-weighted and FLAIR images within the posterior lower midbrain and upper pons. When compared to the MRI dated 05/19/17, edges are unchanged in appearance.  No other structural lesion, tumor or infarcts are noted.No abnormal lesions are seen on diffusion-weighted views to suggest acute ischemia. The cortical sulci, fissures and cisterns are normal in size and appearance. Lateral, third and fourth ventricle are normal in size and  appearance. No extra-axial fluid collections are seen. No evidence of mass effect or midline shift.  No abnormal lesions are seen on post contrast views.  On sagittal views the posterior fossa, pituitary gland and corpus callosum are unremarkable. No evidence of intracranial hemorrhage on gradient-echo views. The orbits and their contents,  and calvarium are unremarkable.  Intracranial flow voids are present.  The paranasal sinuses show chronic inflammatory changes.   Abnormal MRI scan of the brain showing persistent mild T2/flair hyperintensity in the posterior lower midbrain and upper pons which appears to be unchanged compared with previous MRI  from 05/19/2017.  No enhancing lesions are noted.  There also appears to be stable changes of chronic paranasal sinusitis. INTERPRETING PHYSICIAN: Antony Contras, MD Certified in  Neuroimaging by [employer] and [employer]   DG Chest 2 View  Result Date: 12/02/2021 CLINICAL DATA:  Midsternal chest pain. EXAM: CHEST - 2 VIEW COMPARISON:  None. FINDINGS: The heart size and mediastinal contours are within normal limits. Both lungs are clear. The visualized skeletal structures are unremarkable. IMPRESSION: No active cardiopulmonary disease. Electronically Signed   By: Telford Nab M.D.   On: 12/02/2021 03:03     Assessment & Plan:   Tanner Thompson was seen today for anemia.  Diagnoses and all orders for this visit:  Deficiency anemia- Will check labs to identify the cause. -     IBC + Ferritin; Future -     Vitamin B12; Future -     CBC with Differential/Platelet; Future -     Reticulocytes; Future -     Hemoglobinopathy Evaluation; Future -     Vitamin B1; Future -     Zinc; Future -     Folate; Future -     Folate -     Zinc -     Vitamin B1 -     Hemoglobinopathy Evaluation -     Reticulocytes -     CBC with Differential/Platelet -     Vitamin B12 -     IBC + Ferritin  Intercostal pain- He  has lost weight. Labs are significant for anemia. CXR is normal. Will get a CT scan to check for malignancy. -     Troponin I (High Sensitivity); Future -     Cancel: NT-proBNP; Future -     Cancel: D-dimer, quantitative; Future -     DG Chest 2 View; Future -     Troponin I (High Sensitivity) -     CT Chest W Contrast; Future  Chest pain, unspecified type- EKG and labs are reassuring. -     EKG 12-Lead -     CT Chest W Contrast; Future  Anemia due to acquired thiamine deficiency -     thiamine 100 MG tablet; Take 1 tablet (100 mg total) by mouth daily.  Weight loss, unintentional -     CT Chest W Contrast; Future   I am having Tanner [employer] Thompson" start on thiamine. I am also having him maintain his brimonidine-timolol, cholecalciferol, BLACK CURRANT SEED OIL PO, NON FORMULARY, Omega 3-6-9 Fatty Acids (OMEGA-3-6-9 PO), NON FORMULARY, MAGNESIUM PO, Ferrous Sulfate (IRON PO), Coenzyme Q10 (CO Q 10 PO), and traMADol.  Meds ordered this encounter  Medications   thiamine 100 MG tablet    Sig: Take 1 tablet (100 mg total) by mouth daily.    Dispense:  90 tablet    Refill:  1     Follow-up: Return in about 3 months (around 02/28/2022).  Scarlette Calico, MD

## 2021-12-05 ENCOUNTER — Other Ambulatory Visit: Payer: Self-pay | Admitting: Internal Medicine

## 2021-12-05 DIAGNOSIS — E519 Thiamine deficiency, unspecified: Secondary | ICD-10-CM | POA: Insufficient documentation

## 2021-12-05 DIAGNOSIS — D538 Other specified nutritional anemias: Secondary | ICD-10-CM | POA: Insufficient documentation

## 2021-12-05 DIAGNOSIS — R634 Abnormal weight loss: Secondary | ICD-10-CM | POA: Insufficient documentation

## 2021-12-05 LAB — RETICULOCYTES
ABS Retic: 47500 cells/uL (ref 25000–90000)
Retic Ct Pct: 1 %

## 2021-12-05 LAB — HEMOGLOBINOPATHY EVALUATION
Fetal Hemoglobin Testing: <1 % (ref 0.0–1.9)
HCT: 39.3 % (ref 38.5–50.0)
Hemoglobin A2 - HGBRFX: 2.4 % (ref 2.2–3.2)
Hemoglobin: 13 g/dL — ABNORMAL LOW (ref 13.2–17.1)
Hgb A: 97.6 % (ref >96.0)
MCH: 26.7 pg — ABNORMAL LOW (ref 27.0–33.0)
MCV: 80.7 fL (ref 80.0–100.0)
RBC: 4.87 10*6/uL (ref 4.20–5.80)
RDW: 13.5 % (ref 11.0–15.0)

## 2021-12-05 LAB — ZINC: Zinc: 91 ug/dL (ref 60–130)

## 2021-12-05 LAB — VITAMIN B1: Vitamin B1 (Thiamine): 7 nmol/L — ABNORMAL LOW (ref 8–30)

## 2021-12-05 MED ORDER — THIAMINE HCL 100 MG PO TABS: 100.0000 mg | ORAL_TABLET | Freq: Every day | ORAL | 1 refills | Status: DC

## 2021-12-08 ENCOUNTER — Encounter: Payer: Self-pay | Admitting: Internal Medicine

## 2021-12-14 NOTE — Telephone Encounter (Signed)
NOTE NOT NEEDED ?

## 2021-12-17 ENCOUNTER — Other Ambulatory Visit: Payer: Self-pay | Admitting: Internal Medicine

## 2021-12-17 ENCOUNTER — Telehealth: Payer: Self-pay

## 2021-12-17 DIAGNOSIS — N1831 Chronic kidney disease, stage 3a: Secondary | ICD-10-CM

## 2021-12-17 NOTE — Telephone Encounter (Signed)
-----   Message from Janith Lima, MD sent at 12/17/2021  9:10 AM EST ----- ?Regarding: RE: CT scan ?This has been ordered ? ?TJ ? ?----- Message ----- ?From: Jari Pigg, [place] ?Sent: 12/17/2021   8:01 AM EST ?To: Janith Lima, MD ?Subject: FW: CT scan                                   ? ?Let me know once you have ordered the BMET and I will tell the pt.  ? ?----- Message ----- ?From: [place] H ?Sent: 12/16/2021   4:40 PM EST ?To: Janith Lima, MD, Jari Pigg, [place] ?Subject: CT scan                                       ? ?[place] CT requesting patient have BMET prior to CT next week ? ? ? ?

## 2021-12-17 NOTE — Telephone Encounter (Signed)
Pt stated he is out of town right now but will get the lab drawn early next week. ?

## 2021-12-20 ENCOUNTER — Inpatient Hospital Stay: Admission: RE | Admit: 2021-12-20 | Payer: Medicare HMO | Source: Ambulatory Visit

## 2021-12-24 ENCOUNTER — Other Ambulatory Visit (INDEPENDENT_AMBULATORY_CARE_PROVIDER_SITE_OTHER): Payer: Medicare HMO

## 2021-12-24 ENCOUNTER — Other Ambulatory Visit: Payer: Self-pay

## 2021-12-24 DIAGNOSIS — N1831 Chronic kidney disease, stage 3a: Secondary | ICD-10-CM | POA: Diagnosis not present

## 2021-12-24 LAB — BASIC METABOLIC PANEL
BUN: 24 mg/dL — ABNORMAL HIGH (ref 6–23)
CO2: 28 mEq/L (ref 19–32)
Calcium: 9.4 mg/dL (ref 8.4–10.5)
Chloride: 104 mEq/L (ref 96–112)
Creatinine, Ser: 1.21 mg/dL (ref 0.40–1.50)
GFR: 59.96 mL/min — ABNORMAL LOW (ref >60.00)
Glucose, Bld: 91 mg/dL (ref 70–99)
Potassium: 4.6 mEq/L (ref 3.5–5.1)
Sodium: 138 mEq/L (ref 135–145)

## 2021-12-27 ENCOUNTER — Other Ambulatory Visit: Payer: Self-pay

## 2021-12-27 ENCOUNTER — Ambulatory Visit (INDEPENDENT_AMBULATORY_CARE_PROVIDER_SITE_OTHER)
Admission: RE | Admit: 2021-12-27 | Discharge: 2021-12-27 | Disposition: A | Discharge status: Home / Self Care | Payer: Medicare HMO | Source: Ambulatory Visit | Attending: Internal Medicine | Admitting: Internal Medicine

## 2021-12-27 DIAGNOSIS — R079 Chest pain, unspecified: Secondary | ICD-10-CM | POA: Diagnosis not present

## 2021-12-27 DIAGNOSIS — R0782 Intercostal pain: Secondary | ICD-10-CM

## 2021-12-27 DIAGNOSIS — R634 Abnormal weight loss: Secondary | ICD-10-CM

## 2021-12-27 MED ORDER — IOHEXOL 300 MG/ML  SOLN
80.0000 mL | Freq: Once | INTRAMUSCULAR | Status: AC | PRN
  Administered 2021-12-27: 70 mL via INTRAVENOUS

## 2021-12-30 ENCOUNTER — Encounter: Payer: Self-pay | Admitting: Internal Medicine

## 2022-05-09 ENCOUNTER — Encounter: Payer: Self-pay | Admitting: Internal Medicine

## 2022-05-09 ENCOUNTER — Ambulatory Visit (INDEPENDENT_AMBULATORY_CARE_PROVIDER_SITE_OTHER): Payer: Medicare HMO | Admitting: Internal Medicine

## 2022-05-09 VITALS — BP 112/68 | HR 62 | Temp 98.2°F | Resp 16 | Ht 67.0 in | Wt 162.0 lb

## 2022-05-09 DIAGNOSIS — E519 Thiamine deficiency, unspecified: Secondary | ICD-10-CM

## 2022-05-09 DIAGNOSIS — E785 Hyperlipidemia, unspecified: Secondary | ICD-10-CM

## 2022-05-09 DIAGNOSIS — Z23 Encounter for immunization: Secondary | ICD-10-CM | POA: Diagnosis not present

## 2022-05-09 DIAGNOSIS — D538 Other specified nutritional anemias: Secondary | ICD-10-CM

## 2022-05-09 DIAGNOSIS — Z0000 Encounter for general adult medical examination without abnormal findings: Secondary | ICD-10-CM | POA: Diagnosis not present

## 2022-05-09 DIAGNOSIS — N1831 Chronic kidney disease, stage 3a: Secondary | ICD-10-CM

## 2022-05-09 DIAGNOSIS — Z0001 Encounter for general adult medical examination with abnormal findings: Secondary | ICD-10-CM

## 2022-05-09 DIAGNOSIS — N400 Enlarged prostate without lower urinary tract symptoms: Secondary | ICD-10-CM

## 2022-05-09 LAB — HEPATIC FUNCTION PANEL
ALT: 18 U/L (ref 0–53)
AST: 21 U/L (ref 0–37)
Albumin: 4.3 g/dL (ref 3.5–5.2)
Alkaline Phosphatase: 96 U/L (ref 39–117)
Bilirubin, Direct: 0.2 mg/dL (ref 0.0–0.3)
Total Bilirubin: 0.8 mg/dL (ref 0.2–1.2)
Total Protein: 7.2 g/dL (ref 6.0–8.3)

## 2022-05-09 LAB — URINALYSIS, ROUTINE W REFLEX MICROSCOPIC
Bilirubin Urine: NEGATIVE
Hgb urine dipstick: NEGATIVE
Ketones, ur: NEGATIVE
Leukocytes,Ua: NEGATIVE
Nitrite: NEGATIVE
RBC / HPF: NONE SEEN (ref 0–?)
Specific Gravity, Urine: 1.015 (ref 1.000–1.030)
Total Protein, Urine: NEGATIVE
Urine Glucose: NEGATIVE
Urobilinogen, UA: 0.2 (ref 0.0–1.0)
pH: 7.5 (ref 5.0–8.0)

## 2022-05-09 LAB — CBC WITH DIFFERENTIAL/PLATELET
Basophils Absolute: 0 10*3/uL (ref 0.0–0.1)
Basophils Relative: 0.5 % (ref 0.0–3.0)
Eosinophils Absolute: 0.1 10*3/uL (ref 0.0–0.7)
Eosinophils Relative: 2.5 % (ref 0.0–5.0)
HCT: 40.2 % (ref 39.0–52.0)
Hemoglobin: 13.3 g/dL (ref 13.0–17.0)
Lymphocytes Relative: 39.6 % (ref 12.0–46.0)
Lymphs Abs: 1.6 10*3/uL (ref 0.7–4.0)
MCHC: 33 g/dL (ref 30.0–36.0)
MCV: 80.6 fl (ref 78.0–100.0)
Monocytes Absolute: 0.4 10*3/uL (ref 0.1–1.0)
Monocytes Relative: 10.5 % (ref 3.0–12.0)
Neutro Abs: 1.9 10*3/uL (ref 1.4–7.7)
Neutrophils Relative %: 46.9 % (ref 43.0–77.0)
Platelets: 156 10*3/uL (ref 150.0–400.0)
RBC: 4.99 Mil/uL (ref 4.22–5.81)
RDW: 13.7 % (ref 11.5–15.5)
WBC: 4 10*3/uL (ref 4.0–10.5)

## 2022-05-09 LAB — BASIC METABOLIC PANEL
BUN: 18 mg/dL (ref 6–23)
CO2: 29 mEq/L (ref 19–32)
Calcium: 9.1 mg/dL (ref 8.4–10.5)
Chloride: 104 mEq/L (ref 96–112)
Creatinine, Ser: 1.13 mg/dL (ref 0.40–1.50)
GFR: 64.92 mL/min (ref >60.00)
Glucose, Bld: 89 mg/dL (ref 70–99)
Potassium: 4.7 mEq/L (ref 3.5–5.1)
Sodium: 139 mEq/L (ref 135–145)

## 2022-05-09 LAB — LIPID PANEL
Cholesterol: 167 mg/dL (ref 0–200)
HDL: 83.3 mg/dL (ref >39.00)
LDL Cholesterol: 72 mg/dL (ref 0–99)
NonHDL: 84.06
Total CHOL/HDL Ratio: 2
Triglycerides: 59 mg/dL (ref 0.0–149.0)
VLDL: 11.8 mg/dL (ref 0.0–40.0)

## 2022-05-09 LAB — PSA: PSA: 0.42 ng/mL (ref 0.10–4.00)

## 2022-05-09 LAB — TSH: TSH: 0.89 u[IU]/mL (ref 0.35–5.50)

## 2022-05-09 NOTE — Progress Notes (Unsigned)
Subjective:  Patient ID: Tanner Thompson, male    DOB: [birth year]  Age: 71 y.o. MRN: [id number]  CC: Annual Exam and Anemia   HPI Tanner Thompson presents for a CPX and f/up -  He is active and denies chest pain, shortness of breath, diaphoresis, dizziness, lightheadedness, or paresthesias.  Outpatient Medications Prior to Visit  Medication Sig Dispense Refill   BLACK CURRANT SEED OIL PO Take by mouth.     brimonidine-timolol (COMBIGAN) 0.2-0.5 % ophthalmic solution Place 1 drop into both eyes every 12 (twelve) hours.     cholecalciferol (VITAMIN D3) 25 MCG (1000 UT) tablet Take 1,000 Units by mouth daily.     Coenzyme Q10 (CO Q 10 PO) Take 1 tablet by mouth as needed.     Ferrous Sulfate (IRON PO) Take 1 tablet by mouth as needed.     MAGNESIUM PO Take 1 tablet by mouth as needed.      NON FORMULARY Bilbery Extract     NON FORMULARY Boswella Complex     Omega 3-6-9 Fatty Acids (OMEGA-3-6-9 PO) Take 1 tablet by mouth daily.     thiamine 100 MG tablet Take 1 tablet (100 mg total) by mouth daily. 90 tablet 1   traMADol (ULTRAM) 50 MG tablet Take 1 tablet (50 mg total) by mouth every 6 (six) hours as needed. 30 tablet 0   No facility-administered medications prior to visit.    ROS Review of Systems  Constitutional: Negative.  Negative for diaphoresis and fatigue.  HENT: Negative.    Eyes: Negative.   Respiratory:  Negative for cough, chest tightness, shortness of breath and wheezing.   Cardiovascular:  Negative for chest pain, palpitations and leg swelling.  Gastrointestinal:  Negative for abdominal pain, blood in stool, constipation, diarrhea, nausea and vomiting.  Endocrine: Negative.   Genitourinary: Negative.  Negative for difficulty urinating.  Musculoskeletal: Negative.  Negative for myalgias.  Skin: Negative.   Neurological: Negative.  Negative for dizziness, weakness, light-headedness and headaches.  Hematological:  Negative for adenopathy. Does not bruise/bleed  easily.  Psychiatric/Behavioral: Negative.      Objective:  BP 112/68 (BP Location: Left Arm, Patient Position: Sitting, Cuff Size: Large)   Pulse 62   Temp 98.2 F (36.8 C) (Oral)   Resp 16   Ht 5\' 7"  (1.702 m)   Wt 162 lb (73.5 kg)   SpO2 99%   BMI 25.37 kg/m   BP Readings from Last 3 Encounters:  05/09/22 112/68  12/01/21 112/68  06/17/21 102/70    Wt Readings from Last 3 Encounters:  05/09/22 162 lb (73.5 kg)  12/01/21 163 lb (73.9 kg)  06/17/21 167 lb 12.8 oz (76.1 kg)    Physical Exam Vitals reviewed.  HENT:     Nose: Nose normal.     Mouth/Throat:     Mouth: Mucous membranes are moist.  Eyes:     General: No scleral icterus.    Conjunctiva/sclera: Conjunctivae normal.  Cardiovascular:     Rate and Rhythm: Normal rate and regular rhythm.     Heart sounds: No murmur heard. Pulmonary:     Effort: Pulmonary effort is normal.     Breath sounds: No stridor. No wheezing, rhonchi or rales.  Abdominal:     General: Abdomen is flat.     Palpations: There is no mass.     Tenderness: There is no abdominal tenderness. There is no guarding or rebound.     Hernia: No hernia is present. There is no  hernia in the left inguinal area or right inguinal area.  Genitourinary:    Pubic Area: No rash.      Penis: Normal and uncircumcised. No tenderness, swelling or lesions.      Testes: Normal.     Epididymis:     Right: Normal.     Left: Normal.     Prostate: Enlarged. Not tender and no nodules present.     Rectum: Normal. Guaiac result negative. No mass, tenderness, anal fissure, external hemorrhoid or internal hemorrhoid. Normal anal tone.  Musculoskeletal:        General: Normal range of motion.     Cervical back: Neck supple.  Lymphadenopathy:     Cervical: No cervical adenopathy.     Lower Body: No right inguinal adenopathy. No left inguinal adenopathy.  Skin:    General: Skin is warm and dry.  Neurological:     General: No focal deficit present.     Mental  Status: He is alert.  Psychiatric:        Mood and Affect: Mood normal.        Behavior: Behavior normal.     Lab Results  Component Value Date   WBC 4.0 05/09/2022   HGB 13.3 05/09/2022   HCT 40.2 05/09/2022   PLT 156.0 05/09/2022   GLUCOSE 89 05/09/2022   CHOL 167 05/09/2022   TRIG 59.0 05/09/2022   HDL 83.30 05/09/2022   LDLCALC 72 05/09/2022   ALT 18 05/09/2022   AST 21 05/09/2022   NA 139 05/09/2022   K 4.7 05/09/2022   CL 104 05/09/2022   CREATININE 1.13 05/09/2022   BUN 18 05/09/2022   CO2 29 05/09/2022   TSH 0.89 05/09/2022   PSA 0.42 05/09/2022    CT Chest W Contrast  Result Date: 12/29/2021 CLINICAL DATA:  Intercostal pain, anemia, weight loss. EXAM: CT CHEST WITH CONTRAST TECHNIQUE: Multidetector CT imaging of the chest was performed during intravenous contrast administration. RADIATION DOSE REDUCTION: This exam was performed according to the departmental dose-optimization program which includes automated exposure control, adjustment of the mA and/or kV according to patient size and/or use of iterative reconstruction technique. CONTRAST:  56mL OMNIPAQUE IOHEXOL 300 MG/ML  SOLN COMPARISON:  Chest x-ray 12/01/2021 FINDINGS: Cardiovascular: No significant vascular findings. Normal heart size. No pericardial effusion. Mediastinum/Nodes: No enlarged mediastinal, hilar, or axillary lymph nodes. Thyroid gland, trachea, and esophagus demonstrate no significant findings. Lungs/Pleura: Lungs are clear. No pleural effusion or pneumothorax. Upper Abdomen: No acute abnormality. Musculoskeletal: No chest wall abnormality. No acute or significant osseous findings. IMPRESSION: No active cardiopulmonary disease. Electronically Signed   By: Darliss Cheney M.D.   On: 12/29/2021 17:40    Assessment & Plan:   Tanner Thompson was seen today for annual exam and anemia.  Diagnoses and all orders for this visit:  Benign prostatic hyperplasia without lower urinary tract symptoms-his PSA is normal  and he is asymptomatic. -     PSA; Future -     Urinalysis, Routine w reflex microscopic; Future -     Urinalysis, Routine w reflex microscopic -     PSA  Stage 3a chronic kidney disease (HCC)-his renal function is stable.  He is avoiding nephrotoxic agents. -     Basic metabolic panel; Future -     Urinalysis, Routine w reflex microscopic; Future -     Urinalysis, Routine w reflex microscopic -     Basic metabolic panel  Hyperlipidemia LDL goal <130-statin is not indicated. -  Lipid panel; Future -     TSH; Future -     Hepatic function panel; Future -     Hepatic function panel -     TSH -     Lipid panel  Encounter for general adult medical examination with abnormal findings-exam completed, labs reviewed, vaccines reviewed and updated, cancer screenings are up-to-date.  Anemia due to acquired thiamine deficiency -     CBC with Differential/Platelet; Future -     CBC with Differential/Platelet  Other orders -     Pneumococcal polysaccharide vaccine 23-valent greater than or equal to 2yo subcutaneous/IM   I am having Tanner Kitchen C. [facility] [employer]" maintain his brimonidine-timolol, cholecalciferol, BLACK CURRANT SEED OIL PO, NON FORMULARY, Omega 3-6-9 Fatty Acids (OMEGA-3-6-9 PO), NON FORMULARY, MAGNESIUM PO, Ferrous Sulfate (IRON PO), Coenzyme Q10 (CO Q 10 PO), traMADol, and thiamine.  No orders of the defined types were placed in this encounter.    Follow-up: Return in about 6 months (around 11/09/2022).  Scarlette Calico, MD

## 2022-05-09 NOTE — Patient Instructions (Signed)
Health Maintenance, Male Adopting a healthy lifestyle and getting preventive care are important in promoting health and wellness. Ask your health care provider about: The right schedule for you to have regular tests and exams. Things you can do on your own to prevent diseases and keep yourself healthy. What should I know about diet, weight, and exercise? Eat a healthy diet  Eat a diet that includes plenty of vegetables, fruits, low-fat dairy products, and lean protein. Do not eat a lot of foods that are high in solid fats, added sugars, or sodium. Maintain a healthy weight Body mass index (BMI) is a measurement that can be used to identify possible weight problems. It estimates body fat based on height and weight. Your health care provider can help determine your BMI and help you achieve or maintain a healthy weight. Get regular exercise Get regular exercise. This is one of the most important things you can do for your health. Most adults should: Exercise for at least 150 minutes each week. The exercise should increase your heart rate and make you sweat (moderate-intensity exercise). Do strengthening exercises at least twice a week. This is in addition to the moderate-intensity exercise. Spend less time sitting. Even light physical activity can be beneficial. Watch cholesterol and blood lipids Have your blood tested for lipids and cholesterol at 72 years of age, then have this test every 5 years. You may need to have your cholesterol levels checked more often if: Your lipid or cholesterol levels are high. You are older than 72 years of age. You are at high risk for heart disease. What should I know about cancer screening? Many types of cancers can be detected early and may often be prevented. Depending on your health history and family history, you may need to have cancer screening at various ages. This may include screening for: Colorectal cancer. Prostate cancer. Skin cancer. Lung  cancer. What should I know about heart disease, diabetes, and high blood pressure? Blood pressure and heart disease High blood pressure causes heart disease and increases the risk of stroke. This is more likely to develop in people who have high blood pressure readings or are overweight. Talk with your health care provider about your target blood pressure readings. Have your blood pressure checked: Every 3-5 years if you are 18-39 years of age. Every year if you are 40 years old or older. If you are between the ages of 65 and 75 and are a current or former smoker, ask your health care provider if you should have a one-time screening for abdominal aortic aneurysm (AAA). Diabetes Have regular diabetes screenings. This checks your fasting blood sugar level. Have the screening done: Once every three years after age 45 if you are at a normal weight and have a low risk for diabetes. More often and at a younger age if you are overweight or have a high risk for diabetes. What should I know about preventing infection? Hepatitis B If you have a higher risk for hepatitis B, you should be screened for this virus. Talk with your health care provider to find out if you are at risk for hepatitis B infection. Hepatitis C Blood testing is recommended for: Everyone born from 1945 through 1965. Anyone with known risk factors for hepatitis C. Sexually transmitted infections (STIs) You should be screened each year for STIs, including gonorrhea and chlamydia, if: You are sexually active and are younger than 72 years of age. You are older than 72 years of age and your   health care provider tells you that you are at risk for this type of infection. Your sexual activity has changed since you were last screened, and you are at increased risk for chlamydia or gonorrhea. Ask your health care provider if you are at risk. Ask your health care provider about whether you are at high risk for HIV. Your health care provider  may recommend a prescription medicine to help prevent HIV infection. If you choose to take medicine to prevent HIV, you should first get tested for HIV. You should then be tested every 3 months for as long as you are taking the medicine. Follow these instructions at home: Alcohol use Do not drink alcohol if your health care provider tells you not to drink. If you drink alcohol: Limit how much you have to 0-2 drinks a day. Know how much alcohol is in your drink. In the [place] one drink equals one 12 oz bottle of beer (355 mL), one 5 oz glass of wine (148 mL), or one 1 oz glass of hard liquor (44 mL). Lifestyle Do not use any products that contain nicotine or tobacco. These products include cigarettes, chewing tobacco, and vaping devices, such as e-cigarettes. If you need help quitting, ask your health care provider. Do not use street drugs. Do not share needles. Ask your health care provider for help if you need support or information about quitting drugs. General instructions Schedule regular health, dental, and eye exams. Stay current with your vaccines. Tell your health care provider if: You often feel depressed. You have ever been abused or do not feel safe at home. Summary Adopting a healthy lifestyle and getting preventive care are important in promoting health and wellness. Follow your health care provider's instructions about healthy diet, exercising, and getting tested or screened for diseases. Follow your health care provider's instructions on monitoring your cholesterol and blood pressure. This information is not intended to replace advice given to you by your health care provider. Make sure you discuss any questions you have with your health care provider. Document Revised: 02/15/2021 Document Reviewed: 02/15/2021 Elsevier Patient Education  2023 [employer]  

## 2022-05-12 ENCOUNTER — Encounter: Payer: Self-pay | Admitting: Internal Medicine

## 2022-05-25 ENCOUNTER — Encounter: Payer: Self-pay | Admitting: Internal Medicine

## 2022-05-30 ENCOUNTER — Ambulatory Visit (INDEPENDENT_AMBULATORY_CARE_PROVIDER_SITE_OTHER): Payer: Medicare HMO | Admitting: Internal Medicine

## 2022-05-30 ENCOUNTER — Encounter: Payer: Self-pay | Admitting: Internal Medicine

## 2022-05-30 VITALS — BP 110/72 | HR 61 | Temp 98.2°F | Ht 67.0 in | Wt 164.0 lb

## 2022-05-30 DIAGNOSIS — N1831 Chronic kidney disease, stage 3a: Secondary | ICD-10-CM

## 2022-05-30 NOTE — Progress Notes (Unsigned)
Subjective:  Patient ID: Tanner Thompson, male    DOB: [birth year]  Age: 71 y.o. MRN: [id number]  CC: No chief complaint on file.   HPI JABARRI STEFANELLI presents for ***  Outpatient Medications Prior to Visit  Medication Sig Dispense Refill   BLACK CURRANT SEED OIL PO Take by mouth.     brimonidine-timolol (COMBIGAN) 0.2-0.5 % ophthalmic solution Place 1 drop into both eyes every 12 (twelve) hours.     cholecalciferol (VITAMIN D3) 25 MCG (1000 UT) tablet Take 1,000 Units by mouth daily.     Coenzyme Q10 (CO Q 10 PO) Take 1 tablet by mouth as needed.     Ferrous Sulfate (IRON PO) Take 1 tablet by mouth as needed.     MAGNESIUM PO Take 1 tablet by mouth as needed.      NON FORMULARY Bilbery Extract     NON FORMULARY Boswella Complex     Omega 3-6-9 Fatty Acids (OMEGA-3-6-9 PO) Take 1 tablet by mouth daily.     thiamine 100 MG tablet Take 1 tablet (100 mg total) by mouth daily. (Patient not taking: Reported on 05/30/2022) 90 tablet 1   traMADol (ULTRAM) 50 MG tablet Take 1 tablet (50 mg total) by mouth every 6 (six) hours as needed. (Patient not taking: Reported on 05/30/2022) 30 tablet 0   No facility-administered medications prior to visit.    ROS Review of Systems  Objective:  BP 110/72 (BP Location: Left Arm, Patient Position: Sitting, Cuff Size: Normal)   Pulse 61   Temp 98.2 F (36.8 C) (Oral)   Ht 5\' 7"  (1.702 m)   Wt 164 lb (74.4 kg)   SpO2 99%   BMI 25.69 kg/m   BP Readings from Last 3 Encounters:  05/30/22 110/72  05/09/22 112/68  12/01/21 112/68    Wt Readings from Last 3 Encounters:  05/30/22 164 lb (74.4 kg)  05/09/22 162 lb (73.5 kg)  12/01/21 163 lb (73.9 kg)    Physical Exam  Lab Results  Component Value Date   WBC 4.0 05/09/2022   HGB 13.3 05/09/2022   HCT 40.2 05/09/2022   PLT 156.0 05/09/2022   GLUCOSE 89 05/09/2022   CHOL 167 05/09/2022   TRIG 59.0 05/09/2022   HDL 83.30 05/09/2022   LDLCALC 72 05/09/2022   ALT 18 05/09/2022   AST 21  05/09/2022   NA 139 05/09/2022   K 4.7 05/09/2022   CL 104 05/09/2022   CREATININE 1.13 05/09/2022   BUN 18 05/09/2022   CO2 29 05/09/2022   TSH 0.89 05/09/2022   PSA 0.42 05/09/2022    CT Chest W Contrast  Result Date: 12/29/2021 CLINICAL DATA:  Intercostal pain, anemia, weight loss. EXAM: CT CHEST WITH CONTRAST TECHNIQUE: Multidetector CT imaging of the chest was performed during intravenous contrast administration. RADIATION DOSE REDUCTION: This exam was performed according to the departmental dose-optimization program which includes automated exposure control, adjustment of the mA and/or kV according to patient size and/or use of iterative reconstruction technique. CONTRAST:  21mL OMNIPAQUE IOHEXOL 300 MG/ML  SOLN COMPARISON:  Chest x-ray 12/01/2021 FINDINGS: Cardiovascular: No significant vascular findings. Normal heart size. No pericardial effusion. Mediastinum/Nodes: No enlarged mediastinal, hilar, or axillary lymph nodes. Thyroid gland, trachea, and esophagus demonstrate no significant findings. Lungs/Pleura: Lungs are clear. No pleural effusion or pneumothorax. Upper Abdomen: No acute abnormality. Musculoskeletal: No chest wall abnormality. No acute or significant osseous findings. IMPRESSION: No active cardiopulmonary disease. Electronically Signed   By: 12/03/2021.D.  On: 12/29/2021 17:40    Assessment & Plan:   There are no diagnoses linked to this encounter. I am having Sharlet Salina C. Mcginn [employer]" maintain his brimonidine-timolol, cholecalciferol, BLACK CURRANT SEED OIL PO, NON FORMULARY, Omega 3-6-9 Fatty Acids (OMEGA-3-6-9 PO), NON FORMULARY, MAGNESIUM PO, Ferrous Sulfate (IRON PO), Coenzyme Q10 (CO Q 10 PO), traMADol, and thiamine.  No orders of the defined types were placed in this encounter.    Follow-up: No follow-ups on file.  Sanda Linger, MD

## 2022-06-07 ENCOUNTER — Ambulatory Visit: Payer: Medicare HMO

## 2022-06-13 ENCOUNTER — Encounter: Payer: Self-pay | Admitting: Internal Medicine

## 2022-06-15 ENCOUNTER — Ambulatory Visit: Payer: Medicare HMO

## 2022-07-18 ENCOUNTER — Ambulatory Visit (INDEPENDENT_AMBULATORY_CARE_PROVIDER_SITE_OTHER): Payer: Medicare HMO

## 2022-07-18 VITALS — BP 118/75 | HR 65 | Temp 97.2°F | Resp 16 | Ht 67.0 in | Wt 167.0 lb

## 2022-07-18 DIAGNOSIS — Z0000 Encounter for general adult medical examination without abnormal findings: Secondary | ICD-10-CM

## 2022-07-18 NOTE — Progress Notes (Signed)
Subjective:   Tanner Thompson is a 71 y.o. male who presents for Medicare Annual/Subsequent preventive examination.  Review of Systems     Cardiac Risk Factors include: advanced age (>50men, >49 women);dyslipidemia;male gender     Objective:    Today's Vitals   07/18/22 0840  BP: 118/75  Pulse: 65  Resp: 16  Temp: (!) 97.2 F (36.2 C)  SpO2: 99%  Weight: 167 lb (75.8 kg)  Height: 5\' 7"  (1.702 m)  PainSc: 0-No pain   Body mass index is 26.16 kg/m.     07/18/2022    9:09 AM 06/17/2021    1:05 PM 04/09/2019    8:18 AM 04/06/2018   11:22 AM 12/23/2016    6:37 PM  Advanced Directives  Does Patient Have a Medical Advance Directive? Yes No No No No  Type of 12/25/2016 of [place];Living will      Copy of Healthcare Power of Attorney in Chart? No - copy requested      Would patient like information on creating a medical advance directive?  Yes (MAU/Ambulatory/Procedural Areas - Information given) Yes (ED - Information included in AVS) Yes (ED - Information included in AVS) Yes (ED - Information included in AVS)    Current Medications (verified) Outpatient Encounter Medications as of 07/18/2022  Medication Sig   BLACK CURRANT SEED OIL PO Take by mouth.   brimonidine-timolol (COMBIGAN) 0.2-0.5 % ophthalmic solution Place 1 drop into both eyes every 12 (twelve) hours.   cholecalciferol (VITAMIN D3) 25 MCG (1000 UT) tablet Take 1,000 Units by mouth daily.   Coenzyme Q10 (CO Q 10 PO) Take 1 tablet by mouth as needed.   Ferrous Sulfate (IRON PO) Take 1 tablet by mouth as needed.   MAGNESIUM PO Take 1 tablet by mouth as needed.    NON FORMULARY Bilbery Extract   Omega 3-6-9 Fatty Acids (OMEGA-3-6-9 PO) Take 1 tablet by mouth daily.   thiamine 100 MG tablet Take 1 tablet (100 mg total) by mouth daily. (Patient not taking: Reported on 05/30/2022)   [DISCONTINUED] NON FORMULARY Boswella Complex   [DISCONTINUED] traMADol (ULTRAM) 50 MG tablet Take 1 tablet (50 mg  total) by mouth every 6 (six) hours as needed. (Patient not taking: Reported on 05/30/2022)   No facility-administered encounter medications on file as of 07/18/2022.    Allergies (verified) Patient has no known allergies.   History: Past Medical History:  Diagnosis Date   Diplopia    Glaucoma    Pancytopenia (HCC) 12/28/2016   Past Surgical History:  Procedure Laterality Date   CATARACT EXTRACTION Bilateral    One eye laser removal, other removed about 15 yr ago   HEMORROIDECTOMY     Family History  Problem Relation Age of Onset   Breast cancer Mother    Prostate cancer Father    Social History   Socioeconomic History   Marital status: Married    Spouse name: 12/30/2016   Number of children: 2   Years of education: 12   Highest education level: Not on file  Occupational History   Occupation: Retired - [employer]   Tobacco Use   Smoking status: Never   Smokeless tobacco: Never  Midwife Use: Never used  Substance and Sexual Activity   Alcohol use: Yes    Comment: Occasional   Drug use: No   Sexual activity: Yes  Other Topics Concern   Not on file  Social History Narrative   Lives with  spouse   Fun: Reading, art, physical activity   Social Determinants of Health   Financial Resource Strain: Low Risk  (07/18/2022)   Overall Financial Resource Strain (CARDIA)    Difficulty of Paying Living Expenses: Not hard at all  Food Insecurity: No Food Insecurity (07/18/2022)   Hunger Vital Sign    Worried About Running Out of Food in the Last Year: Never true    Ran Out of Food in the Last Year: Never true  Transportation Needs: No Transportation Needs (07/18/2022)   PRAPARE - Hydrologist (Medical): No    Lack of Transportation (Non-Medical): No  Physical Activity: Sufficiently Active (07/18/2022)   Exercise Vital Sign    Days of Exercise per Week: 7 days    Minutes of Exercise per Session: 60 min  Stress: No Stress Concern Present  (07/18/2022)   [place]    Feeling of Stress : Not at all  Social Connections: [place] (07/18/2022)   Social Connection and Isolation Panel [NHANES]    Frequency of Communication with Friends and Family: More than three times a week    Frequency of Social Gatherings with Friends and Family: More than three times a week    Attends Religious Services: More than 4 times per year    Active Member of [employer] or Organizations: Yes    Attends Music therapist: More than 4 times per year    Marital Status: Married    Tobacco Counseling Counseling given: Not Answered   Clinical Intake:  Pre-visit preparation completed: Yes  Pain : No/denies pain Pain Score: 0-No pain     BMI - recorded: 26.16 Nutritional Status: BMI 25 -29 Overweight Nutritional Risks: None Diabetes: No  How often do you need to have someone help you when you read instructions, pamphlets, or other written materials from your doctor or pharmacy?: 1 - Never What is the last grade level you completed in school?: HSG  Diabetic? No  Interpreter Needed?: No  Information entered by :: Lisette Abu, LPN.   Activities of Daily Living    07/18/2022    8:44 AM  In your present state of health, do you have any difficulty performing the following activities:  Hearing? 0  Vision? 0  Difficulty concentrating or making decisions? 0  Walking or climbing stairs? 0  Dressing or bathing? 0  Doing errands, shopping? 0  Preparing Food and eating ? N  Using the Toilet? N  In the past six months, have you accidently leaked urine? N  Do you have problems with loss of bowel control? N  Managing your Medications? N  Managing your Finances? N  Housekeeping or managing your Housekeeping? N    Patient Care Team: Janith Lima, MD as PCP - General (Internal Medicine) Kathrynn Ducking, MD (Inactive) as Consulting Physician  (Neurology) Iran Planas, MD as Consulting Physician (Orthopedic Surgery) Alanda Slim Neena Rhymes, MD as Consulting Physician (Ophthalmology)  Indicate any recent Medical Services you may have received from other than [facility] providers in the past year (date may be approximate).     Assessment:   This is a routine wellness examination for Tanner Thompson.  Hearing/Vision screen Hearing Screening - Comments:: Denies hearing difficulties   Vision Screening - Comments:: Wears rx glasses - up to date with routine eye exams with Julian Reil, MD.   Dietary issues and exercise activities discussed: Current Exercise Habits: Home exercise routine, Type of exercise: walking,  Time (Minutes): 60, Frequency (Times/Week): 7, Weekly Exercise (Minutes/Week): 420, Intensity: Moderate, Exercise limited by: None identified   Goals Addressed             This Visit's Progress    My goal is to lose 5 pounds.        Depression Screen    07/18/2022    8:42 AM 05/30/2022    2:59 PM 06/17/2021    1:26 PM 05/03/2021    8:39 AM 04/09/2020    8:07 AM 04/09/2019    8:19 AM 11/21/2018    3:14 PM  PHQ 2/9 Scores  PHQ - 2 Score 0 0 0 0 0 0 4  PHQ- 9 Score 1 3     10     Fall Risk    07/18/2022    8:44 AM 05/30/2022    2:59 PM 06/17/2021    1:31 PM 05/03/2021    8:39 AM 04/09/2020    8:07 AM  Fall Risk   Falls in the past year? 0 0 0 0 0  Number falls in past yr: 0 0 0  0  Injury with Fall? 0 0 0  0  Risk for fall due to : No Fall Risks No Fall Risks No Fall Risks  No Fall Risks  Follow up Falls prevention discussed Falls evaluation completed Falls evaluation completed  Falls evaluation completed    FALL RISK PREVENTION PERTAINING TO THE HOME:  Any stairs in or around the home? No  If so, are there any without handrails? No  Home free of loose throw rugs in walkways, pet beds, electrical cords, etc? Yes  Adequate lighting in your home to reduce risk of falls? Yes   ASSISTIVE DEVICES UTILIZED TO PREVENT  FALLS:  Life alert? No  Use of a cane, walker or w/c? No  Grab bars in the bathroom? No  Shower chair or bench in shower? No  Elevated toilet seat or a handicapped toilet? No   TIMED UP AND GO:  Was the test performed? Yes .  Length of time to ambulate 10 feet: 6 sec.   Gait steady and fast without use of assistive device  Cognitive Function:        07/18/2022    8:44 AM  6CIT Screen  What Year? 0 points  What month? 0 points  What time? 0 points  Count back from 20 0 points  Months in reverse 0 points  Repeat phrase 0 points  Total Score 0 points    Immunizations Immunization History  Administered Date(s) Administered   Fluad Quad(high Dose 65+) 06/08/2019, 06/16/2021, 07/14/2022   Influenza, High Dose Seasonal PF 07/19/2018   PFIZER(Purple Top)SARS-COV-2 Vaccination 11/14/2019, 12/05/2019, 07/28/2020, 01/12/2021   Pfizer Covid-19 Vaccine Bivalent Booster 64yrs & up 07/02/2021   Pneumococcal Conjugate-13 02/20/2017   Pneumococcal Polysaccharide-23 08/13/2015, 05/09/2022   Respiratory Syncytial Virus Vaccine,Recomb Aduvanted(Arexvy) 07/14/2022   Tdap 08/17/2017   Zoster Recombinat (Shingrix) 06/05/2017, 09/04/2017    TDAP status: Up to date  Flu Vaccine status: Up to date  Pneumococcal vaccine status: Up to date  Covid-19 vaccine status: Completed vaccines  Qualifies for Shingles Vaccine? Yes   Zostavax completed No   Shingrix Completed?: Yes  Screening Tests Health Maintenance  Topic Date Due   COVID-19 Vaccine (6 - Pfizer series) 11/01/2021   COLONOSCOPY (Pts 45-25yrs Insurance coverage will need to be confirmed)  03/15/2023   TETANUS/TDAP  08/18/2027   Pneumonia Vaccine 33+ Years old  Completed   INFLUENZA VACCINE  Completed   Hepatitis C Screening  Completed   Zoster Vaccines- Shingrix  Completed   HPV VACCINES  Aged Out    Health Maintenance  Health Maintenance Due  Topic Date Due   COVID-19 Vaccine (6 - Pfizer series) 11/01/2021     Colorectal cancer screening: Type of screening: Colonoscopy. Completed 03/14/2013. Repeat every 10 years  Lung Cancer Screening: (Low Dose CT Chest recommended if Age 15-80 years, 30 pack-year currently smoking OR have quit w/in 15years.) does not qualify.   Lung Cancer Screening Referral: no  Additional Screening:  Hepatitis C Screening: does qualify; Completed 02/26/2016  Vision Screening: Recommended annual ophthalmology exams for early detection of glaucoma and other disorders of the eye. Is the patient up to date with their annual eye exam?  Yes  Who is the provider or what is the name of the office in which the patient attends annual eye exams? Mack Hook, MD. If pt is not established with a provider, would they like to be referred to a provider to establish care? No .   Dental Screening: Recommended annual dental exams for proper oral hygiene  Community Resource Referral / Chronic Care Management: CRR required this visit?  No   CCM required this visit?  No      Plan:     I have personally reviewed and noted the following in the patient's chart:   Medical and social history Use of alcohol, tobacco or illicit drugs  Current medications and supplements including opioid prescriptions. Patient is not currently taking opioid prescriptions. Functional ability and status Nutritional status Physical activity Advanced directives List of other physicians Hospitalizations, surgeries, and ER visits in previous 12 months Vitals Screenings to include cognitive, depression, and falls Referrals and appointments  In addition, I have reviewed and discussed with patient certain preventive protocols, quality metrics, and best practice recommendations. A written personalized care plan for preventive services as well as general preventive health recommendations were provided to patient.     Mickeal Needy, LPN   18/02/6313   Nurse Notes: N/A

## 2022-07-18 NOTE — Patient Instructions (Addendum)
Tanner Thompson , Thank you for taking time to come for your Medicare Wellness Visit. I appreciate your ongoing commitment to your health goals. Please review the following plan we discussed and let me know if I can assist you in the future.   These are the goals we discussed:  Goals      My goal is to lose 5 pounds.        This is a list of the screening recommended for you and due dates:  Health Maintenance  Topic Date Due   COVID-19 Vaccine (6 - Pfizer series) 11/01/2021   Colon Cancer Screening  03/15/2023   Tetanus Vaccine  08/18/2027   Pneumonia Vaccine  Completed   Flu Shot  Completed   Hepatitis C Screening: USPSTF Recommendation to screen - Ages 18-79 yo.  Completed   Zoster (Shingles) Vaccine  Completed   HPV Vaccine  Aged Out    Advanced directives: Yes  Conditions/risks identified: Yes  Next appointment: Follow up in one year for your annual wellness visit.   Preventive Care 39 Years and Older, Male  Preventive care refers to lifestyle choices and visits with your health care provider that can promote health and wellness. What does preventive care include? A yearly physical exam. This is also called an annual well check. Dental exams once or twice a year. Routine eye exams. Ask your health care provider how often you should have your eyes checked. Personal lifestyle choices, including: Daily care of your teeth and gums. Regular physical activity. Eating a healthy diet. Avoiding tobacco and drug use. Limiting alcohol use. Practicing safe sex. Taking low doses of aspirin every day. Taking vitamin and mineral supplements as recommended by your health care provider. What happens during an annual well check? The services and screenings done by your health care provider during your annual well check will depend on your age, overall health, lifestyle risk factors, and family history of disease. Counseling  Your health care provider may ask you questions about  your: Alcohol use. Tobacco use. Drug use. Emotional well-being. Home and relationship well-being. Sexual activity. Eating habits. History of falls. Memory and ability to understand (cognition). Work and work Statistician. Screening  You may have the following tests or measurements: Height, weight, and BMI. Blood pressure. Lipid and cholesterol levels. These may be checked every 5 years, or more frequently if you are over 30 years old. Skin check. Lung cancer screening. You may have this screening every year starting at age 42 if you have a 30-pack-year history of smoking and currently smoke or have quit within the past 15 years. Fecal occult blood test (FOBT) of the stool. You may have this test every year starting at age 52. Flexible sigmoidoscopy or colonoscopy. You may have a sigmoidoscopy every 5 years or a colonoscopy every 10 years starting at age 8. Prostate cancer screening. Recommendations will vary depending on your family history and other risks. Hepatitis C blood test. Hepatitis B blood test. Sexually transmitted disease (STD) testing. Diabetes screening. This is done by checking your blood sugar (glucose) after you have not eaten for a while (fasting). You may have this done every 1-3 years. Abdominal aortic aneurysm (AAA) screening. You may need this if you are a current or former smoker. Osteoporosis. You may be screened starting at age 26 if you are at high risk. Talk with your health care provider about your test results, treatment options, and if necessary, the need for more tests. Vaccines  Your health care provider  may recommend certain vaccines, such as: Influenza vaccine. This is recommended every year. Tetanus, diphtheria, and acellular pertussis (Tdap, Td) vaccine. You may need a Td booster every 10 years. Zoster vaccine. You may need this after age 65. Pneumococcal 13-valent conjugate (PCV13) vaccine. One dose is recommended after age 55. Pneumococcal  polysaccharide (PPSV23) vaccine. One dose is recommended after age 59. Talk to your health care provider about which screenings and vaccines you need and how often you need them. This information is not intended to replace advice given to you by your health care provider. Make sure you discuss any questions you have with your health care provider. Document Released: 10/23/2015 Document Revised: 06/15/2016 Document Reviewed: 07/28/2015 Elsevier Interactive Patient Education  2017 [place] Prevention in the Home Falls can cause injuries. They can happen to people of all ages. There are many things you can do to make your home safe and to help prevent falls. What can I do on the outside of my home? Regularly fix the edges of walkways and driveways and fix any cracks. Remove anything that might make you trip as you walk through a door, such as a raised step or threshold. Trim any bushes or trees on the path to your home. Use bright outdoor lighting. Clear any walking paths of anything that might make someone trip, such as rocks or tools. Regularly check to see if handrails are loose or broken. Make sure that both sides of any steps have handrails. Any raised decks and porches should have guardrails on the edges. Have any leaves, snow, or ice cleared regularly. Use sand or salt on walking paths during winter. Clean up any spills in your garage right away. This includes oil or grease spills. What can I do in the bathroom? Use night lights. Install grab bars by the toilet and in the tub and shower. Do not use towel bars as grab bars. Use non-skid mats or decals in the tub or shower. If you need to sit down in the shower, use a plastic, non-slip stool. Keep the floor dry. Clean up any water that spills on the floor as soon as it happens. Remove soap buildup in the tub or shower regularly. Attach bath mats securely with double-sided non-slip rug tape. Do not have throw rugs and other  things on the floor that can make you trip. What can I do in the bedroom? Use night lights. Make sure that you have a light by your bed that is easy to reach. Do not use any sheets or blankets that are too big for your bed. They should not hang down onto the floor. Have a firm chair that has side arms. You can use this for support while you get dressed. Do not have throw rugs and other things on the floor that can make you trip. What can I do in the kitchen? Clean up any spills right away. Avoid walking on wet floors. Keep items that you use a lot in easy-to-reach places. If you need to reach something above you, use a strong step stool that has a grab bar. Keep electrical cords out of the way. Do not use floor polish or wax that makes floors slippery. If you must use wax, use non-skid floor wax. Do not have throw rugs and other things on the floor that can make you trip. What can I do with my stairs? Do not leave any items on the stairs. Make sure that there are handrails on both sides  of the stairs and use them. Fix handrails that are broken or loose. Make sure that handrails are as long as the stairways. Check any carpeting to make sure that it is firmly attached to the stairs. Fix any carpet that is loose or worn. Avoid having throw rugs at the top or bottom of the stairs. If you do have throw rugs, attach them to the floor with carpet tape. Make sure that you have a light switch at the top of the stairs and the bottom of the stairs. If you do not have them, ask someone to add them for you. What else can I do to help prevent falls? Wear shoes that: Do not have high heels. Have rubber bottoms. Are comfortable and fit you well. Are closed at the toe. Do not wear sandals. If you use a stepladder: Make sure that it is fully opened. Do not climb a closed stepladder. Make sure that both sides of the stepladder are locked into place. Ask someone to hold it for you, if possible. Clearly  mark and make sure that you can see: Any grab bars or handrails. First and last steps. Where the edge of each step is. Use tools that help you move around (mobility aids) if they are needed. These include: Canes. Walkers. Scooters. Crutches. Turn on the lights when you go into a dark area. Replace any light bulbs as soon as they burn out. Set up your furniture so you have a clear path. Avoid moving your furniture around. If any of your floors are uneven, fix them. If there are any pets around you, be aware of where they are. Review your medicines with your doctor. Some medicines can make you feel dizzy. This can increase your chance of falling. Ask your doctor what other things that you can do to help prevent falls. This information is not intended to replace advice given to you by your health care provider. Make sure you discuss any questions you have with your health care provider. Document Released: 07/23/2009 Document Revised: 03/03/2016 Document Reviewed: 10/31/2014 Elsevier Interactive Patient Education  2017 [employer].

## 2022-07-19 ENCOUNTER — Encounter: Payer: Self-pay | Admitting: Internal Medicine

## 2022-09-08 ENCOUNTER — Encounter: Payer: Self-pay | Admitting: Internal Medicine

## 2022-11-09 ENCOUNTER — Encounter: Payer: Self-pay | Admitting: Internal Medicine

## 2022-11-09 ENCOUNTER — Ambulatory Visit (INDEPENDENT_AMBULATORY_CARE_PROVIDER_SITE_OTHER): Payer: HMO | Admitting: Internal Medicine

## 2022-11-09 VITALS — BP 114/64 | HR 72 | Temp 97.9°F | Resp 16 | Ht 67.0 in | Wt 163.0 lb

## 2022-11-09 DIAGNOSIS — Z23 Encounter for immunization: Secondary | ICD-10-CM

## 2022-11-09 DIAGNOSIS — Z1211 Encounter for screening for malignant neoplasm of colon: Secondary | ICD-10-CM

## 2022-11-09 DIAGNOSIS — E519 Thiamine deficiency, unspecified: Secondary | ICD-10-CM

## 2022-11-09 DIAGNOSIS — N1831 Chronic kidney disease, stage 3a: Secondary | ICD-10-CM

## 2022-11-09 DIAGNOSIS — L989 Disorder of the skin and subcutaneous tissue, unspecified: Secondary | ICD-10-CM | POA: Diagnosis not present

## 2022-11-09 DIAGNOSIS — D538 Other specified nutritional anemias: Secondary | ICD-10-CM | POA: Diagnosis not present

## 2022-11-09 LAB — CBC WITH DIFFERENTIAL/PLATELET
Basophils Absolute: 0 10*3/uL (ref 0.0–0.1)
Basophils Relative: 0.2 % (ref 0.0–3.0)
Eosinophils Absolute: 0.1 10*3/uL (ref 0.0–0.7)
Eosinophils Relative: 2.5 % (ref 0.0–5.0)
HCT: 39.4 % (ref 39.0–52.0)
Hemoglobin: 13 g/dL (ref 13.0–17.0)
Lymphocytes Relative: 44.8 % (ref 12.0–46.0)
Lymphs Abs: 1.8 10*3/uL (ref 0.7–4.0)
MCHC: 33.1 g/dL (ref 30.0–36.0)
MCV: 79.5 fl (ref 78.0–100.0)
Monocytes Absolute: 0.5 10*3/uL (ref 0.1–1.0)
Monocytes Relative: 11.6 % (ref 3.0–12.0)
Neutro Abs: 1.7 10*3/uL (ref 1.4–7.7)
Neutrophils Relative %: 40.9 % — ABNORMAL LOW (ref 43.0–77.0)
Platelets: 183 10*3/uL (ref 150.0–400.0)
RBC: 4.95 Mil/uL (ref 4.22–5.81)
RDW: 14 % (ref 11.5–15.5)
WBC: 4.1 10*3/uL (ref 4.0–10.5)

## 2022-11-09 LAB — BASIC METABOLIC PANEL
BUN: 18 mg/dL (ref 6–23)
CO2: 28 mEq/L (ref 19–32)
Calcium: 9.2 mg/dL (ref 8.4–10.5)
Chloride: 105 mEq/L (ref 96–112)
Creatinine, Ser: 1.18 mg/dL (ref 0.40–1.50)
GFR: 61.41 mL/min (ref >60.00)
Glucose, Bld: 97 mg/dL (ref 70–99)
Potassium: 4.5 mEq/L (ref 3.5–5.1)
Sodium: 138 mEq/L (ref 135–145)

## 2022-11-09 LAB — URINALYSIS, ROUTINE W REFLEX MICROSCOPIC
Bilirubin Urine: NEGATIVE
Hgb urine dipstick: NEGATIVE
Ketones, ur: NEGATIVE
Leukocytes,Ua: NEGATIVE
Nitrite: NEGATIVE
RBC / HPF: NONE SEEN (ref 0–?)
Specific Gravity, Urine: 1.02 (ref 1.000–1.030)
Total Protein, Urine: NEGATIVE
Urine Glucose: NEGATIVE
Urobilinogen, UA: 0.2 (ref 0.0–1.0)
pH: 6.5 (ref 5.0–8.0)

## 2022-11-09 NOTE — Patient Instructions (Signed)
Chronic Kidney Disease, Adult Chronic kidney disease (CKD) occurs when the kidneys are slowly and permanently damaged over a long period of time. The kidneys are a pair of organs that do many important jobs in the body, including: Removing waste and extra fluid from the blood to make urine. Making hormones that maintain the amount of fluid in tissues and blood vessels. Maintaining the right amount of fluids and chemicals in the body. A small amount of kidney damage may not cause problems, but a large amount of damage may make it hard or impossible for the kidneys to work right. Steps must be taken to slow kidney damage or to stop it from getting worse. If steps are not taken, the kidneys may stop working permanently (end-stage renal disease, or ESRD). Most of the time, CKD does not go away, but it can often be controlled. People who have CKD are usually able to live full lives. What are the causes? The most common causes of this condition are diabetes and high blood pressure (hypertension). Other causes include: Cardiovascular diseases. These affect the heart and blood vessels. Kidney diseases. These include: Glomerulonephritis, or inflammation of the tiny filters in the kidneys. Interstitial nephritis. This is swelling of the small tubes of the kidneys and of the surrounding structures. Polycystic kidney disease, in which clusters of fluid-filled sacs form within the kidneys. Renal vascular disease. This includes disorders that affect the arteries and veins of the kidneys. Diseases that affect the body's defense system (immune system). A problem with urine flow. This may be caused by: Kidney stones. Cancer. An enlarged prostate, in males. A kidney infection or urinary tract infection (UTI) that keeps coming back. Vasculitis. This is swelling or inflammation of the blood vessels. What increases the risk? Your chances of having kidney disease increase with age. The following factors may make  you more likely to develop this condition: A family history of kidney disease or kidney failure. Kidney failure means the kidneys can no longer work right. Certain genetic diseases. Taking medicines often that are damaging to the kidneys. Being around or being in contact with toxic substances. Obesity. A history of tobacco use. What are the signs or symptoms? Symptoms of this condition include: Feeling very tired (lethargic) and having less energy. Swelling, or edema, of the face, legs, ankles, or feet. Nausea or vomiting, or loss of appetite. Confusion or trouble concentrating. Muscle twitches and cramps, especially in the legs. Dry, itchy skin. A metallic taste in the mouth. Producing less urine, or producing more urine (especially at night). Shortness of breath. Trouble sleeping. CKD may also result in not having enough red blood cells or hemoglobin in the blood (anemia) or having weak bones (bone disease). Symptoms develop slowly and may not be obvious until the kidney damage becomes severe. It is possible to have kidney disease for years without having symptoms. How is this diagnosed? This condition may be diagnosed based on: Blood tests. Urine tests. Imaging tests, such as an ultrasound or a CT scan. A kidney biopsy. This involves removing a sample of kidney tissue to be looked at under a microscope. Results from these tests will help to determine how serious the CKD is. How is this treated? There is no cure for most cases of this condition, but treatment usually relieves symptoms and prevents or slows the worsening of the disease. Treatment may include: Diet changes, which may require you to avoid alcohol and foods that are high in salt, potassium, phosphorous, and protein. Medicines. These may:   Lower blood pressure. Control blood sugar (glucose). Relieve anemia. Relieve swelling. Protect your bones. Improve the balance of salts and minerals in your blood  (electrolytes). Dialysis, which is a type of treatment that removes toxic waste from the body. It may be needed if you have kidney failure. Managing any other conditions that are causing your CKD or making it worse. Follow these instructions at home: Medicines Take over-the-counter and prescription medicines only as told by your health care provider. The amount of some medicines that you take may need to be changed. Do not take any new medicines unless approved by your health care provider. Many medicines can make kidney damage worse. Do not take any vitamin and mineral supplements unless approved by your health care provider. Many nutritional supplements can make kidney damage worse. Lifestyle  Do not use any products that contain nicotine or tobacco, such as cigarettes, e-cigarettes, and chewing tobacco. If you need help quitting, ask your health care provider. If you drink alcohol: Limit how much you use to: 0-1 drink a day for women who are not pregnant. 0-2 drinks a day for men. Know how much alcohol is in your drink. In the [place] one drink equals one 12 oz bottle of beer (355 mL), one 5 oz glass of wine (148 mL), or one 1 oz glass of hard liquor (44 mL). Maintain a healthy weight. If you need help, ask your health care provider. General instructions  Follow instructions from your health care provider about eating or drinking restrictions, including any prescribed diet. Track your blood pressure at home. Report changes in your blood pressure as told. If you are being treated for diabetes, track your blood glucose levels as told. Start or continue an exercise plan. Exercise at least 30 minutes a day, 5 days a week. Keep your immunizations up to date as told. Keep all follow-up visits. This is important. Where to find more information American Association of Kidney Patients: www.aakp.org National Kidney Foundation: www.kidney.org American Kidney Fund: www.akfinc.org Life Options:  www.lifeoptions.org Kidney School: www.kidneyschool.org Contact a health care provider if: Your symptoms get worse. You develop new symptoms. Get help right away if: You develop symptoms of ESRD. These include: Headaches. Numbness in your hands or feet. Easy bruising. Frequent hiccups. Chest pain. Shortness of breath. Lack of menstrual periods, in women. You have a fever. You are producing less urine than usual. You have pain or bleeding when you urinate or when you have a bowel movement. These symptoms may represent a serious problem that is an emergency. Do not wait to see if the symptoms will go away. Get medical help right away. Call your local emergency services (911 in the [place] Do not drive yourself to the hospital. Summary Chronic kidney disease (CKD) occurs when the kidneys become damaged slowly over a long period of time. The most common causes of this condition are diabetes and high blood pressure (hypertension). There is no cure for most cases of CKD, but treatment usually relieves symptoms and prevents or slows the worsening of the disease. Treatment may include a combination of lifestyle changes, medicines, and dialysis. This information is not intended to replace advice given to you by your health care provider. Make sure you discuss any questions you have with your health care provider. Document Revised: 01/01/2020 Document Reviewed: 01/01/2020 Elsevier Patient Education  2023 Elsevier Inc.  

## 2022-11-09 NOTE — Progress Notes (Unsigned)
Subjective:  Patient ID: Tanner Thompson, male    DOB: 1951-02-26  Age: 72 y.o. MRN: [id number]  CC: Anemia   HPI WAKE CONLEE presents for f/up -  He has had a lesion in his right axilla for two years and he would like to have it removed.  Outpatient Medications Prior to Visit  Medication Sig Dispense Refill   brimonidine-timolol (COMBIGAN) 0.2-0.5 % ophthalmic solution Place 1 drop into both eyes every 12 (twelve) hours.     Coenzyme Q10 (CO Q 10 PO) Take 1 tablet by mouth as needed.     Ferrous Sulfate (IRON PO) Take 1 tablet by mouth as needed.     MAGNESIUM PO Take 1 tablet by mouth as needed.      BLACK CURRANT SEED OIL PO Take by mouth.     cholecalciferol (VITAMIN D3) 25 MCG (1000 UT) tablet Take 1,000 Units by mouth daily.     NON FORMULARY Bilbery Extract     Omega 3-6-9 Fatty Acids (OMEGA-3-6-9 PO) Take 1 tablet by mouth daily.     thiamine 100 MG tablet Take 1 tablet (100 mg total) by mouth daily. (Patient not taking: Reported on 05/30/2022) 90 tablet 1   No facility-administered medications prior to visit.    ROS Review of Systems  Objective:  BP 114/64 (BP Location: Left Arm, Patient Position: Sitting, Cuff Size: Large)   Pulse 72   Temp 97.9 F (36.6 C) (Oral)   Resp 16   Ht 5\' 7"  (1.702 m)   Wt 163 lb (73.9 kg)   SpO2 98%   BMI 25.53 kg/m   BP Readings from Last 3 Encounters:  11/09/22 114/64  07/18/22 118/75  05/30/22 110/72    Wt Readings from Last 3 Encounters:  11/09/22 163 lb (73.9 kg)  07/18/22 167 lb (75.8 kg)  05/30/22 164 lb (74.4 kg)    Physical Exam  Lab Results  Component Value Date   WBC 4.1 11/09/2022   HGB 13.0 11/09/2022   HCT 39.4 11/09/2022   PLT 183.0 11/09/2022   GLUCOSE 97 11/09/2022   CHOL 167 05/09/2022   TRIG 59.0 05/09/2022   HDL 83.30 05/09/2022   LDLCALC 72 05/09/2022   ALT 18 05/09/2022   AST 21 05/09/2022   NA 138 11/09/2022   K 4.5 11/09/2022   CL 105 11/09/2022   CREATININE 1.18 11/09/2022    BUN 18 11/09/2022   CO2 28 11/09/2022   TSH 0.89 05/09/2022   PSA 0.42 05/09/2022    CT Chest W Contrast  Result Date: 12/29/2021 CLINICAL DATA:  Intercostal pain, anemia, weight loss. EXAM: CT CHEST WITH CONTRAST TECHNIQUE: Multidetector CT imaging of the chest was performed during intravenous contrast administration. RADIATION DOSE REDUCTION: This exam was performed according to the departmental dose-optimization program which includes automated exposure control, adjustment of the mA and/or kV according to patient size and/or use of iterative reconstruction technique. CONTRAST:  43mL OMNIPAQUE IOHEXOL 300 MG/ML  SOLN COMPARISON:  Chest x-ray 12/01/2021 FINDINGS: Cardiovascular: No significant vascular findings. Normal heart size. No pericardial effusion. Mediastinum/Nodes: No enlarged mediastinal, hilar, or axillary lymph nodes. Thyroid gland, trachea, and esophagus demonstrate no significant findings. Lungs/Pleura: Lungs are clear. No pleural effusion or pneumothorax. Upper Abdomen: No acute abnormality. Musculoskeletal: No chest wall abnormality. No acute or significant osseous findings. IMPRESSION: No active cardiopulmonary disease. Electronically Signed   By: Ronney Asters M.D.   On: 12/29/2021 17:40    Assessment & Plan:   Sevon was seen  today for anemia.  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 Staubs "Ben"'s cholecalciferol, BLACK CURRANT SEED OIL PO, NON FORMULARY, Omega 3-6-9 Fatty Acids (OMEGA-3-6-9 PO), and thiamine. I am also having him maintain his brimonidine-timolol, MAGNESIUM PO, Ferrous Sulfate (IRON PO), and Coenzyme Q10 (CO Q 10 PO).  No orders of the defined types were placed in this encounter.    Follow-up: Return in about 6 months (around 05/10/2023).  Scarlette Calico, MD

## 2022-11-10 ENCOUNTER — Encounter: Payer: Self-pay | Admitting: Internal Medicine

## 2022-11-10 DIAGNOSIS — L989 Disorder of the skin and subcutaneous tissue, unspecified: Secondary | ICD-10-CM | POA: Insufficient documentation

## 2022-11-14 DIAGNOSIS — H401131 Primary open-angle glaucoma, bilateral, mild stage: Secondary | ICD-10-CM | POA: Diagnosis not present

## 2022-11-17 ENCOUNTER — Encounter: Payer: Self-pay | Admitting: Internal Medicine

## 2022-11-21 ENCOUNTER — Encounter: Payer: Self-pay | Admitting: Physician Assistant

## 2022-12-07 ENCOUNTER — Ambulatory Visit (INDEPENDENT_AMBULATORY_CARE_PROVIDER_SITE_OTHER): Payer: HMO | Admitting: Plastic Surgery

## 2022-12-07 ENCOUNTER — Encounter: Payer: Self-pay | Admitting: Plastic Surgery

## 2022-12-07 VITALS — BP 127/79 | HR 61 | Ht 67.5 in | Wt 162.8 lb

## 2022-12-07 DIAGNOSIS — Z872 Personal history of diseases of the skin and subcutaneous tissue: Secondary | ICD-10-CM | POA: Diagnosis not present

## 2022-12-07 DIAGNOSIS — L910 Hypertrophic scar: Secondary | ICD-10-CM

## 2022-12-07 NOTE — Progress Notes (Signed)
Tanner Thompson is a very pleasant 72 year old male who presents today for evaluation of a small skin lesion in his right axilla.   Referring Provider Janith Lima, MD [place],  [place] [address]   CC:  Chief Complaint  Patient presents with   Advice Only      Tanner Thompson is an 72 y.o. male.  HPI: Tanner Thompson is a very pleasant 72 year old male who presents today for evaluation of a small skin lesion in his right axilla.  He states that he had a cyst or infection in this area and it was percutaneously drained.  The skin lesion arose after that.  He is otherwise healthy with no specific complaints.  No Known Allergies  Outpatient Encounter Medications as of 12/07/2022  Medication Sig   brimonidine-timolol (COMBIGAN) 0.2-0.5 % ophthalmic solution Place 1 drop into both eyes every 12 (twelve) hours.   cholecalciferol 25 MCG (1000 UT) tablet Take by mouth.   Coenzyme Q10 (CO Q 10 PO) Take 1 tablet by mouth as needed.   Ferrous Sulfate (IRON PO) Take 1 tablet by mouth as needed.   MAGNESIUM PO Take 1 tablet by mouth as needed.    No facility-administered encounter medications on file as of 12/07/2022.     Past Medical History:  Diagnosis Date   Diplopia    Glaucoma    Pancytopenia ([place]) 12/28/2016    Past Surgical History:  Procedure Laterality Date   CATARACT EXTRACTION Bilateral    One eye laser removal, other removed about 15 yr ago   HEMORROIDECTOMY      Family History  Problem Relation Age of Onset   Breast cancer Mother    Prostate cancer Father     Social History   Social History Narrative   Lives with spouse   Fun: Reading, art, physical activity     Review of Systems General: Denies fevers, chills, weight loss CV: Denies chest pain, shortness of breath, palpitations Skin: 1 cm skin lesion in the right axilla  Physical Exam    12/07/2022    2:27 PM 11/09/2022   10:46 AM 07/18/2022    8:40 AM  Vitals with BMI  Height 5' 7.5" '5\' 7"'$  '5\' 7"'$    Weight 162 lbs 13 oz 163 lbs 167 lbs  BMI 25.11 [id number]  Systolic [id number]  Diastolic 79 64 75  Pulse 61 72 65    General:  No acute distress,  Alert and oriented, Non-Toxic, Normal speech and affect Integument: There is a 1 cm skin lesion in the right axilla consistent with a keloid scar.   Assessment/Plan Keloid: This appears to be a keloid secondary to an infection in the hairbearing area of the axilla.  We discussed several options for treatment including excision with steroid injection versus excision with low-dose radiation therapy.  I also told him that I thought it was reasonable to inject the area with steroids and have him massage it for 4 weeks and return to see if there was any improvement in the scar.  If the scar was smaller or had resolved that he would no longer need surgery or could have additional steroid injections as indicated.  If it has not improved we would plan for surgical excision with either steroid injection or low-dose radiation therapy.  He was happy with this plan and asked that I inject the keloid today with steroid.   Tanner Thompson 12/07/2022, 3:21 PM    Procedure Note  Preoperative  Dx: Keloid scar, 1 cm, right axilla  Postoperative Dx: Same  Procedure: Injection of keloid with Kenalog 40 and 1% lidocaine and a 50-50 mixture    Indication for Procedure: Treatment of keloid  Description of Procedure: Risks and complications were explained to the patient including the need for additional steroid injections or surgical excision.  Consent was confirmed and the patient understands the risks and benefits.  The potential complications and alternatives were explained and the patient consents.  The patient expressed understanding the option of not having the procedure and the risks of a scar.  Time out was called and all information was confirmed to be correct.    The keloid was injected with 0.4 mL of a 50-50 mixture of Kenalog 40 and 1% lidocaine.    the patient was given instructions on how to care for the area and a follow up appointment.  Tanner Thompson tolerated the procedure well and there were no complications.

## 2022-12-20 NOTE — Progress Notes (Unsigned)
12/22/2022 CHEN COXE QP:[id number] 1951-04-07  Referring provider: Janith Lima, MD Primary GI doctor: Dr. Hilarie Fredrickson  ASSESSMENT AND PLAN:   Screen for colon cancer We have discussed the risks of bleeding, infection, perforation, medication reactions, and remote risk of death associated with colonoscopy. All questions were answered and the patient acknowledges these risk and wishes to proceed.  Anemia due to acquired thiamine deficiency 12/01/21 normal iron/ferritin Low thiamine, improved with replacement.   Stage 3a chronic kidney disease [facility])   Patient Care Team: Janith Lima, MD as PCP - General (Internal Medicine) Kathrynn Ducking, MD (Inactive) as Consulting Physician (Neurology) Iran Planas, MD as Consulting Physician (Orthopedic Surgery) Alanda Slim Neena Rhymes, MD as Consulting Physician (Ophthalmology)  HISTORY OF PRESENT ILLNESS: 72 y.o. male with a past medical history of CKD stage III yea, anemia due to thiamine deficiency and others listed below presents for evaluation of colonoscopy.   Previous anemia 05/03/2021 Hgb 12.4, 12/01/2021 Hgb 12.6, most recently has been 13.   12/01/2021 iron studies reviewed show iron 82, ferritin 200, normal B12 thiamine replaced and improved.  MCV 80.7 normal platelets and WBC.  He states last colonoscopy 2014 in [place], recall 10 years.  Patient denies family history of colon cancer or other gastrointestinal malignancies. Patient has a BM every day or every other day. .  Patient denies change in bowel habits, constipation, diarrhea, hematochezia.  Denies changes in appetite, unintentional weight loss.  Patient denies GERD.  Patient denies dysphagia, nausea, vomiting, melena.    He  reports that he has never smoked. He has never used smokeless tobacco. He reports current alcohol use. He reports that he does not use drugs.  RELEVANT LABS AND IMAGING: CBC    Component Value Date/Time   WBC 4.1 11/09/2022 1132    RBC 4.95 11/09/2022 1132   HGB 13.0 11/09/2022 1132   HGB 12.4 (L) 05/04/2017 0805   HCT 39.4 11/09/2022 1132   HCT 37.8 05/04/2017 0805   PLT 183.0 11/09/2022 1132   PLT 154 05/04/2017 0805   MCV 79.5 11/09/2022 1132   MCV 79 05/04/2017 0805   MCH 26.7 (L) 12/01/2021 0956   MCHC 33.1 11/09/2022 1132   RDW 14.0 11/09/2022 1132   RDW 14.4 05/04/2017 0805   LYMPHSABS 1.8 11/09/2022 1132   LYMPHSABS 1.6 05/04/2017 0805   MONOABS 0.5 11/09/2022 1132   EOSABS 0.1 11/09/2022 1132   EOSABS 0.2 05/04/2017 0805   BASOSABS 0.0 11/09/2022 1132   BASOSABS 0.0 05/04/2017 0805   Recent Labs    05/09/22 1118 11/09/22 1132  HGB 13.3 13.0     CMP     Component Value Date/Time   NA 138 11/09/2022 1132   NA 140 02/26/2016 0000   K 4.5 11/09/2022 1132   CL 105 11/09/2022 1132   CO2 28 11/09/2022 1132   GLUCOSE 97 11/09/2022 1132   BUN 18 11/09/2022 1132   CREATININE 1.18 11/09/2022 1132   CREATININE 1.22 (H) 04/30/2020 1343   CALCIUM 9.2 11/09/2022 1132   PROT 7.2 05/09/2022 1118   PROT 7.1 12/28/2016 1613   ALBUMIN 4.3 05/09/2022 1118   AST 21 05/09/2022 1118   ALT 18 05/09/2022 1118   ALKPHOS 96 05/09/2022 1118   BILITOT 0.8 05/09/2022 1118   GFRNONAA >60 12/23/2016 1855   GFRAA >60 12/23/2016 1855      Latest Ref Rng & Units 05/09/2022   11:18 AM 05/03/2021    9:17 AM 04/30/2020  1:43 PM  Hepatic Function  Total Protein 6.0 - 8.3 g/dL 7.2  6.8  7.1   Albumin 3.5 - 5.2 g/dL 4.3  4.2    AST 0 - 37 U/L '21  18  17   '$ ALT 0 - 53 U/L '18  14  18   '$ Alk Phosphatase 39 - 117 U/L 96  110    Total Bilirubin 0.2 - 1.2 mg/dL 0.8  0.7  0.9   Bilirubin, Direct 0.0 - 0.3 mg/dL 0.2  0.2  0.2       Current Medications:       Current Outpatient Medications (Hematological):    Ferrous Sulfate (IRON PO), Take 1 tablet by mouth as needed.  Current Outpatient Medications (Other):    brimonidine-timolol (COMBIGAN) 0.2-0.5 % ophthalmic solution, Place 1 drop into both eyes every 12  (twelve) hours.   cholecalciferol 25 MCG (1000 UT) tablet, Take by mouth.   Coenzyme Q10 (CO Q 10 PO), Take 1 tablet by mouth as needed.   MAGNESIUM PO, Take 1 tablet by mouth as needed.    Na Sulfate-K Sulfate-Mg Sulf 17.5-3.13-1.6 GM/177ML SOLN, Take 1 kit by mouth once for 1 dose.  Medical History:  Past Medical History:  Diagnosis Date   Diplopia    Glaucoma    Pancytopenia ([place]) 12/28/2016   Allergies: No Known Allergies   Surgical History:  He  has a past surgical history that includes Cataract extraction (Bilateral) and Hemorroidectomy. Family History:  His family history includes Breast cancer in his mother; Prostate cancer in his father.  REVIEW OF SYSTEMS  : All other systems reviewed and negative except where noted in the History of Present Illness.  PHYSICAL EXAM: BP 128/74 (BP Location: Left Arm, Patient Position: Sitting, Cuff Size: Normal)   Pulse 67   Ht 5' 7.5" (1.715 m)   Wt 166 lb 6 oz (75.5 kg)   SpO2 99%   BMI 25.67 kg/m  General Appearance: Well nourished, in no apparent distress. Head:   Normocephalic and atraumatic. Eyes:  sclerae anicteric,conjunctive pink  Respiratory: Respiratory effort normal, BS equal bilaterally without rales, rhonchi, wheezing. Cardio: RRR with no MRGs. Peripheral pulses intact.  Abdomen: Soft,  Non-distended ,active bowel sounds. No tenderness . No masses. Rectal: Not evaluated Musculoskeletal: Full ROM, Normal gait. Without edema. Skin:  Dry and intact without significant lesions or rashes Neuro: Alert and  oriented x4;  No focal deficits. Psych:  Cooperative. Normal mood and affect.    Vladimir Crofts, PA-C 11:11 AM

## 2022-12-22 ENCOUNTER — Encounter: Payer: Self-pay | Admitting: Physician Assistant

## 2022-12-22 ENCOUNTER — Ambulatory Visit (INDEPENDENT_AMBULATORY_CARE_PROVIDER_SITE_OTHER): Payer: HMO | Admitting: Physician Assistant

## 2022-12-22 VITALS — BP 128/74 | HR 67 | Ht 67.5 in | Wt 166.4 lb

## 2022-12-22 DIAGNOSIS — E519 Thiamine deficiency, unspecified: Secondary | ICD-10-CM

## 2022-12-22 DIAGNOSIS — N1831 Chronic kidney disease, stage 3a: Secondary | ICD-10-CM | POA: Diagnosis not present

## 2022-12-22 DIAGNOSIS — D538 Other specified nutritional anemias: Secondary | ICD-10-CM | POA: Diagnosis not present

## 2022-12-22 DIAGNOSIS — Z1211 Encounter for screening for malignant neoplasm of colon: Secondary | ICD-10-CM | POA: Diagnosis not present

## 2022-12-22 MED ORDER — NA SULFATE-K SULFATE-MG SULF 17.5-3.13-1.6 GM/177ML PO SOLN: 1.0000 | Freq: Once | ORAL | 0 refills | Status: AC

## 2022-12-22 NOTE — Patient Instructions (Signed)
You have been scheduled for a colonoscopy. Please follow written instructions given to you at your visit today.  Please pick up your prep supplies at the pharmacy within the next 1-3 days. If you use inhalers (even only as needed), please bring them with you on the day of your procedure.   _______________________________________________________  If your blood pressure at your visit was 140/90 or greater, please contact your primary care physician to follow up on this.  _______________________________________________________  If you are age 68 or older, your body mass index should be between 23-30. Your Body mass index is 25.67 kg/m. If this is out of the aforementioned range listed, please consider follow up with your Primary Care Provider.  If you are age 36 or younger, your body mass index should be between 19-25. Your Body mass index is 25.67 kg/m. If this is out of the aformentioned range listed, please consider follow up with your Primary Care Provider.   ________________________________________________________  The [facility] providers would like to encourage you to use [facility] to communicate with providers for non-urgent requests or questions.  Due to long hold times on the telephone, sending your provider a message by [facility] may be a faster and more efficient way to get a response.  Please allow 48 business hours for a response.  Please remember that this is for non-urgent requests.  _______________________________________________________   Due to recent changes in healthcare laws, you may see the results of your imaging and laboratory studies on MyChart before your provider has had a chance to review them.  We understand that in some cases there may be results that are confusing or concerning to you. Not all laboratory results come back in the same time frame and the provider may be waiting for multiple results in order to interpret others.  Please give [place] 48 hours in order for your  provider to thoroughly review all the results before contacting the office for clarification of your results.    Thank you for choosing [facility]  [facility]

## 2023-01-03 ENCOUNTER — Encounter: Payer: Self-pay | Admitting: Internal Medicine

## 2023-01-03 ENCOUNTER — Other Ambulatory Visit (HOSPITAL_COMMUNITY): Payer: Self-pay

## 2023-01-04 ENCOUNTER — Encounter: Payer: Self-pay | Admitting: Plastic Surgery

## 2023-01-04 ENCOUNTER — Other Ambulatory Visit (HOSPITAL_COMMUNITY): Payer: Self-pay

## 2023-01-04 ENCOUNTER — Ambulatory Visit (INDEPENDENT_AMBULATORY_CARE_PROVIDER_SITE_OTHER): Payer: HMO | Admitting: Plastic Surgery

## 2023-01-04 VITALS — BP 121/72 | HR 75

## 2023-01-04 DIAGNOSIS — L910 Hypertrophic scar: Secondary | ICD-10-CM | POA: Diagnosis not present

## 2023-01-04 NOTE — Progress Notes (Signed)
Procedure Note  Preoperative Dx: Right axillary keloid  Postoperative Dx: Same  Procedure: Injection of right axillary keloid    Indication for Procedure: Keloid secondary to skin infection  Description of Procedure: Risks and complications were explained to the patient including no improvement in the keloid.  Consent was confirmed and the patient understands the risks and benefits.  The potential complications and alternatives were explained and the patient consents.  The patient expressed understanding the option of not having the procedure and the risks of no improvement.  Time out was called and all information was confirmed to be correct.    The keloid was prepped with an alcohol wipe and injected with 0.2 mL of a 50-50 mix of Kenalog 40 and 1% lidocaine.  Tanner Thompson tolerated the procedure well and there were no complications.  If he has significant improvement in the keloid over the next 4 weeks then he will return for a third  injection.  If there is no improvement then we will just plan to excise the keloid with steroid injection at the time of excision.

## 2023-01-05 ENCOUNTER — Other Ambulatory Visit (HOSPITAL_COMMUNITY): Payer: Self-pay

## 2023-01-05 ENCOUNTER — Encounter (HOSPITAL_COMMUNITY): Payer: Self-pay

## 2023-01-05 MED ORDER — BRIMONIDINE TARTRATE-TIMOLOL 0.2-0.5 % OP SOLN
1.0000 [drp] | Freq: Two times a day (BID) | OPHTHALMIC | 3 refills | Status: DC
  Filled 2023-01-05: qty 10, 100d supply, fill #0
  Filled 2023-01-06: qty 15, 56d supply, fill #0
  Filled 2023-01-10: qty 15, 75d supply, fill #0

## 2023-01-06 ENCOUNTER — Other Ambulatory Visit: Payer: Self-pay

## 2023-01-06 ENCOUNTER — Other Ambulatory Visit (HOSPITAL_COMMUNITY): Payer: Self-pay

## 2023-01-07 ENCOUNTER — Other Ambulatory Visit (HOSPITAL_COMMUNITY): Payer: Self-pay

## 2023-01-10 ENCOUNTER — Other Ambulatory Visit (HOSPITAL_COMMUNITY): Payer: Self-pay

## 2023-01-10 ENCOUNTER — Other Ambulatory Visit: Payer: Self-pay

## 2023-01-11 ENCOUNTER — Other Ambulatory Visit (HOSPITAL_COMMUNITY): Payer: Self-pay

## 2023-01-24 ENCOUNTER — Encounter: Payer: Self-pay | Admitting: Internal Medicine

## 2023-02-01 ENCOUNTER — Encounter: Payer: Self-pay | Admitting: Internal Medicine

## 2023-02-01 ENCOUNTER — Ambulatory Visit (AMBULATORY_SURGERY_CENTER): Payer: HMO | Admitting: Internal Medicine

## 2023-02-01 VITALS — BP 116/67 | HR 76 | Temp 97.1°F | Resp 12 | Ht 67.5 in | Wt 166.0 lb

## 2023-02-01 DIAGNOSIS — D125 Benign neoplasm of sigmoid colon: Secondary | ICD-10-CM | POA: Diagnosis not present

## 2023-02-01 DIAGNOSIS — Z1211 Encounter for screening for malignant neoplasm of colon: Secondary | ICD-10-CM

## 2023-02-01 DIAGNOSIS — D123 Benign neoplasm of transverse colon: Secondary | ICD-10-CM

## 2023-02-01 DIAGNOSIS — D120 Benign neoplasm of cecum: Secondary | ICD-10-CM | POA: Diagnosis not present

## 2023-02-01 DIAGNOSIS — D122 Benign neoplasm of ascending colon: Secondary | ICD-10-CM

## 2023-02-01 MED ORDER — SODIUM CHLORIDE 0.9 % IV SOLN: 500.0000 mL | INTRAVENOUS | Status: DC

## 2023-02-01 NOTE — Progress Notes (Signed)
GASTROENTEROLOGY PROCEDURE H&P NOTE   Primary Care Physician: Etta Grandchild, MD    Reason for Procedure:  Colon cancer screening  Plan:    Colonoscopy  Patient is appropriate for endoscopic procedure(s) in the ambulatory ([facility]) setting.  The nature of the procedure, as well as the risks, benefits, and alternatives were carefully and thoroughly reviewed with the patient. Ample time for discussion and questions allowed. The patient understood, was satisfied, and agreed to proceed.     HPI: Tanner Thompson is a 72 y.o. male who presents for colonoscopy.  Medical history as below.  Tolerated the prep.  No recent chest pain or shortness of breath.  No abdominal pain today.  Past Medical History:  Diagnosis Date   Diplopia    Glaucoma    Pancytopenia 12/28/2016    Past Surgical History:  Procedure Laterality Date   CATARACT EXTRACTION Bilateral    One eye laser removal, other removed about 15 yr ago   HEMORROIDECTOMY      Prior to Admission medications   Medication Sig Start Date End Date Taking? Authorizing Provider  brimonidine-timolol (COMBIGAN) 0.2-0.5 % ophthalmic solution Place 1 drop into both eyes every 12 (twelve) hours.   Yes [provider]  brimonidine-timolol (COMBIGAN) 0.2-0.5 % ophthalmic solution Place 1 drop into both eyes every 12 (twelve) hours. 01/05/23     cholecalciferol 25 MCG (1000 UT) tablet Take by mouth. 03/22/21   [provider]  Coenzyme Q10 (CO Q 10 PO) Take 1 tablet by mouth as needed.    [provider]  Ferrous Sulfate (IRON PO) Take 1 tablet by mouth as needed.    [provider]  MAGNESIUM PO Take 1 tablet by mouth as needed.     [provider]    Current Outpatient Medications  Medication Sig Dispense Refill   brimonidine-timolol (COMBIGAN) 0.2-0.5 % ophthalmic solution Place 1 drop into both eyes every 12 (twelve) hours.     brimonidine-timolol (COMBIGAN) 0.2-0.5 % ophthalmic solution  Place 1 drop into both eyes every 12 (twelve) hours. 15 mL 3   cholecalciferol 25 MCG (1000 UT) tablet Take by mouth.     Coenzyme Q10 (CO Q 10 PO) Take 1 tablet by mouth as needed.     Ferrous Sulfate (IRON PO) Take 1 tablet by mouth as needed.     MAGNESIUM PO Take 1 tablet by mouth as needed.      Current Facility-Administered Medications  Medication Dose Route Frequency Provider Last Rate Last Admin   0.9 %  sodium chloride infusion  500 mL Intravenous Continuous Nevae Pinnix, Carie Caddy, MD        Allergies as of 02/01/2023   (No Known Allergies)    Family History  Problem Relation Age of Onset   Breast cancer Mother    Prostate cancer Father    Colon cancer Neg Hx    Rectal cancer Neg Hx    Stomach cancer Neg Hx     Social History   Socioeconomic History   Marital status: Married    Spouse name: Steward Drone   Number of children: 2   Years of education: 12   Highest education level: Not on file  Occupational History   Occupation: Retired - Midwife   Tobacco Use   Smoking status: Never   Smokeless tobacco: Never  Vaping Use   Vaping Use: Never used  Substance and Sexual Activity   Alcohol use: Yes    Comment: Occasional   Drug  use: No   Sexual activity: Yes  Other Topics Concern   Not on file  Social History Narrative   Lives with spouse   Fun: Reading, art, physical activity   Social Determinants of Health   Financial Resource Strain: Low Risk  (07/18/2022)   Overall Financial Resource Strain (CARDIA)    Difficulty of Paying Living Expenses: Not hard at all  Food Insecurity: No Food Insecurity (07/18/2022)   Hunger Vital Sign    Worried About Running Out of Food in the Last Year: Never true    Ran Out of Food in the Last Year: Never true  Transportation Needs: No Transportation Needs (07/18/2022)   PRAPARE - Administrator, Civil Service (Medical): No    Lack of Transportation (Non-Medical): No  Physical Activity: Sufficiently Active (07/18/2022)    Exercise Vital Sign    Days of Exercise per Week: 7 days    Minutes of Exercise per Session: 60 min  Stress: No Stress Concern Present (07/18/2022)   [employer] of Occupational Health - Occupational Stress Questionnaire    Feeling of Stress : Not at all  Social Connections: Socially Integrated (07/18/2022)   Social Connection and Isolation Panel [NHANES]    Frequency of Communication with Friends and Family: More than three times a week    Frequency of Social Gatherings with Friends and Family: More than three times a week    Attends Religious Services: More than 4 times per year    Active Member of [employer] or Organizations: Yes    Attends Engineer, structural: More than 4 times per year    Marital Status: Married  Catering manager Violence: Not At Risk (07/18/2022)   Humiliation, Afraid, Rape, and Kick questionnaire    Fear of Current or Ex-Partner: No    Emotionally Abused: No    Physically Abused: No    Sexually Abused: No    Physical Exam: Vital signs in last 24 hours:  134/69   Pulse (!) 58   Temp (!) 97.1 F (36.2 C)   Resp 14   Ht 5' 7.5" (1.715 m)   Wt 166 lb (75.3 kg)   SpO2 100%   BMI 25.62 kg/m  GEN: NAD EYE: Sclerae anicteric ENT: MMM CV: Non-tachycardic Pulm: CTA b/l GI: Soft, NT/ND NEURO:  Alert & Oriented x 3   Erick Blinks, MD [facility]  02/01/2023 2:50 PM

## 2023-02-01 NOTE — Progress Notes (Signed)
Pt's states no medical or surgical changes since previsit or office visit. 

## 2023-02-01 NOTE — Progress Notes (Unsigned)
Uneventful anesthetic. Report to pacu rn. Vss. Care resumed by rn. 

## 2023-02-01 NOTE — Patient Instructions (Signed)
Handout given on polyps  YOU HAD AN ENDOSCOPIC PROCEDURE TODAY AT THE [facility]:   Refer to the procedure report that was given to you for any specific questions about what was found during the examination.  If the procedure report does not answer your questions, please call your gastroenterologist to clarify.  If you requested that your care partner not be given the details of your procedure findings, then the procedure report has been included in a sealed envelope for you to review at your convenience later.  YOU SHOULD EXPECT: Some feelings of bloating in the abdomen. Passage of more gas than usual.  Walking can help get rid of the air that was put into your GI tract during the procedure and reduce the bloating. If you had a lower endoscopy (such as a colonoscopy or flexible sigmoidoscopy) you may notice spotting of blood in your stool or on the toilet paper. If you underwent a bowel prep for your procedure, you may not have a normal bowel movement for a few days.  Please Note:  You might notice some irritation and congestion in your nose or some drainage.  This is from the oxygen used during your procedure.  There is no need for concern and it should clear up in a day or so.  SYMPTOMS TO REPORT IMMEDIATELY:  Following lower endoscopy (colonoscopy or flexible sigmoidoscopy):  Excessive amounts of blood in the stool  Significant tenderness or worsening of abdominal pains  Swelling of the abdomen that is new, acute  Fever of 100F or higher   For urgent or emergent issues, a gastroenterologist can be reached at any hour by calling [phone]. Do not use MyChart messaging for urgent concerns.    DIET:  We do recommend a small meal at first, but then you may proceed to your regular diet.  Drink plenty of fluids but you should avoid alcoholic beverages for 24 hours.  ACTIVITY:  You should plan to take it easy for the rest of today and you should NOT DRIVE or use heavy  machinery until tomorrow (because of the sedation medicines used during the test).    FOLLOW UP: Our staff will call the number listed on your records the next business day following your procedure.  We will call around 7:15- 8:00 am to check on you and address any questions or concerns that you may have regarding the information given to you following your procedure. If we do not reach you, we will leave a message.     If any biopsies were taken you will be contacted by phone or by letter within the next 1-3 weeks.  Please call us at [phone] if you have not heard about the biopsies in 3 weeks.    SIGNATURES/CONFIDENTIALITY: You and/or your care partner have signed paperwork which will be entered into your electronic medical record.  These signatures attest to the fact that that the information above on your After Visit Summary has been reviewed and is understood.  Full responsibility of the confidentiality of this discharge information lies with you and/or your care-partner.  

## 2023-02-01 NOTE — Op Note (Signed)
[facility] Patient Name: Tanner Thompson Procedure Date: 02/01/2023 2:47 PM MRN: [id number] Endoscopist: Beverley Fiedler , MD, [id number] Age: 72 Referring MD:  Date of Birth: June 21, 1951 Gender: Male Account #: [id number] Procedure:                Colonoscopy Indications:              Screening for colorectal malignant neoplasm, Last                            colonoscopy 10 years ago Medicines:                Monitored Anesthesia Care Procedure:                Pre-Anesthesia Assessment:                           - Prior to the procedure, a History and Physical                            was performed, and patient medications and                            allergies were reviewed. The patient's tolerance of                            previous anesthesia was also reviewed. The risks                            and benefits of the procedure and the sedation                            options and risks were discussed with the patient.                            All questions were answered, and informed consent                            was obtained. Prior Anticoagulants: The patient has                            taken no anticoagulant or antiplatelet agents. ASA                            Grade Assessment: I - A normal, healthy patient.                            After reviewing the risks and benefits, the patient                            was deemed in satisfactory condition to undergo the                            procedure.  After obtaining informed consent, the colonoscope                            was passed under direct vision. Throughout the                            procedure, the patient's blood pressure, pulse, and                            oxygen saturations were monitored continuously. The                            Olympus Scope SN [id number] was introduced through the                            anus and advanced to the cecum, identified by                             appendiceal orifice and ileocecal valve. The                            colonoscopy was performed without difficulty. The                            patient tolerated the procedure well. The quality                            of the bowel preparation was good. The ileocecal                            valve, appendiceal orifice, and rectum were                            photographed. Scope In: 2:57:37 PM Scope Out: 3:15:00 PM Scope Withdrawal Time: 0 hours 15 minutes 1 second  Total Procedure Duration: 0 hours 17 minutes 23 seconds  Findings:                 The digital rectal exam was normal.                           A 3 mm polyp was found in the cecum. The polyp was                            sessile. The polyp was removed with a cold snare.                            Resection and retrieval were complete.                           Two sessile polyps were found in the ascending                            colon. The polyps were 4 to 5 mm  in size. These                            polyps were removed with a cold snare. Resection                            and retrieval were complete.                           A 5 mm polyp was found in the transverse colon. The                            polyp was sessile. The polyp was removed with a                            cold snare. Resection and retrieval were complete.                           A 4 mm polyp was found in the sigmoid colon. The                            polyp was sessile. The polyp was removed with a                            cold snare. Resection and retrieval were complete.                           The retroflexed view of the distal rectum and anal                            verge was normal and showed no anal or rectal                            abnormalities. Complications:            No immediate complications. Estimated Blood Loss:     Estimated blood loss was minimal. Impression:                - One 3 mm polyp in the cecum, removed with a cold                            snare. Resected and retrieved.                           - Two 4 to 5 mm polyps in the ascending colon,                            removed with a cold snare. Resected and retrieved.                           - One 5 mm polyp in the transverse colon, removed  with a cold snare. Resected and retrieved.                           - One 4 mm polyp in the sigmoid colon, removed with                            a cold snare. Resected and retrieved.                           - The distal rectum and anal verge are normal on                            retroflexion view. Recommendation:           - Patient has a contact number available for                            emergencies. The signs and symptoms of potential                            delayed complications were discussed with the                            patient. Return to normal activities tomorrow.                            Written discharge instructions were provided to the                            patient.                           - Resume previous diet.                           - Continue present medications.                           - Await pathology results.                           - Repeat colonoscopy is recommended. The                            colonoscopy date will be determined after pathology                            results from today's exam become available for                            review. Beverley Fiedler, MD 02/01/2023 3:17:57 PM This report has been signed electronically.

## 2023-02-01 NOTE — Progress Notes (Signed)
Called to room to assist during endoscopic procedure.  Patient ID and intended procedure confirmed with present staff. Received instructions for my participation in the procedure from the performing physician.  

## 2023-02-02 ENCOUNTER — Ambulatory Visit (INDEPENDENT_AMBULATORY_CARE_PROVIDER_SITE_OTHER): Payer: HMO | Admitting: Plastic Surgery

## 2023-02-02 ENCOUNTER — Telehealth: Payer: Self-pay

## 2023-02-02 VITALS — BP 114/72 | HR 70

## 2023-02-02 DIAGNOSIS — L910 Hypertrophic scar: Secondary | ICD-10-CM

## 2023-02-02 NOTE — Telephone Encounter (Signed)
Left message on follow up call. 

## 2023-02-02 NOTE — Progress Notes (Signed)
Mr. Swider returns today for evaluation of the keloid into his right axilla.  He states that it is improved significantly since his last steroid injection and would like to have another injection today.  Procedure: Steroid injection, keloid, right axilla  The 2 cm keloid was prepped with alcohol and injected with 0.2 mL of a 50-50 mixture of Kenalog 40 and 1% lidocaine.  Mr. Camino tolerated the procedure well and there were no complications.  Instructed to continue massaging the keloid and will follow-up with me in 4 to 6 weeks.

## 2023-02-07 ENCOUNTER — Encounter: Payer: Self-pay | Admitting: Internal Medicine

## 2023-03-08 ENCOUNTER — Ambulatory Visit (INDEPENDENT_AMBULATORY_CARE_PROVIDER_SITE_OTHER): Payer: HMO | Admitting: Plastic Surgery

## 2023-03-08 VITALS — BP 125/73 | HR 76

## 2023-03-08 DIAGNOSIS — L910 Hypertrophic scar: Secondary | ICD-10-CM | POA: Diagnosis not present

## 2023-03-08 NOTE — Progress Notes (Signed)
Tanner Thompson returns today for evaluation of the keloid in his right axilla.  He states that it is completely resolved and he is quite happy with the results of the previous steroid injections.  On examination there is no palpable keloid in his axilla.  He will return if he feels that the keloid started to recur or he has any further issues.  Follow-up as needed

## 2023-04-26 LAB — HEMOGLOBIN A1C: Hemoglobin A1C: 5.7

## 2023-05-01 ENCOUNTER — Telehealth: Payer: Self-pay | Admitting: *Deleted

## 2023-05-01 NOTE — Telephone Encounter (Signed)
I connected with Tanner Thompson on 7/22 at 1203 by telephone and verified that I am speaking with the correct person using two identifiers. According to the patient's chart they are due for follow up  with [facility]. Pt scheduled. There are no transportation issues at this time. Nothing further was needed at the end of our conversation.

## 2023-05-10 ENCOUNTER — Encounter (INDEPENDENT_AMBULATORY_CARE_PROVIDER_SITE_OTHER): Payer: Self-pay

## 2023-05-23 DIAGNOSIS — H401131 Primary open-angle glaucoma, bilateral, mild stage: Secondary | ICD-10-CM | POA: Diagnosis not present

## 2023-05-30 ENCOUNTER — Ambulatory Visit: Payer: HMO | Admitting: Internal Medicine

## 2023-05-30 ENCOUNTER — Encounter: Payer: Self-pay | Admitting: Internal Medicine

## 2023-05-30 VITALS — BP 104/64 | HR 60 | Temp 98.0°F | Resp 16 | Ht 67.5 in | Wt 163.0 lb

## 2023-05-30 DIAGNOSIS — E519 Thiamine deficiency, unspecified: Secondary | ICD-10-CM | POA: Diagnosis not present

## 2023-05-30 DIAGNOSIS — Z0001 Encounter for general adult medical examination with abnormal findings: Secondary | ICD-10-CM

## 2023-05-30 DIAGNOSIS — D538 Other specified nutritional anemias: Secondary | ICD-10-CM

## 2023-05-30 DIAGNOSIS — Z0000 Encounter for general adult medical examination without abnormal findings: Secondary | ICD-10-CM | POA: Diagnosis not present

## 2023-05-30 DIAGNOSIS — N1831 Chronic kidney disease, stage 3a: Secondary | ICD-10-CM

## 2023-05-30 DIAGNOSIS — E785 Hyperlipidemia, unspecified: Secondary | ICD-10-CM

## 2023-05-30 DIAGNOSIS — R3912 Poor urinary stream: Secondary | ICD-10-CM | POA: Diagnosis not present

## 2023-05-30 DIAGNOSIS — R001 Bradycardia, unspecified: Secondary | ICD-10-CM

## 2023-05-30 DIAGNOSIS — N401 Enlarged prostate with lower urinary tract symptoms: Secondary | ICD-10-CM | POA: Diagnosis not present

## 2023-05-30 DIAGNOSIS — I950 Idiopathic hypotension: Secondary | ICD-10-CM

## 2023-05-30 LAB — HEPATIC FUNCTION PANEL
ALT: 15 U/L (ref 0–53)
AST: 17 U/L (ref 0–37)
Albumin: 4.3 g/dL (ref 3.5–5.2)
Alkaline Phosphatase: 105 U/L (ref 39–117)
Bilirubin, Direct: 0.2 mg/dL (ref 0.0–0.3)
Total Bilirubin: 0.8 mg/dL (ref 0.2–1.2)
Total Protein: 7.1 g/dL (ref 6.0–8.3)

## 2023-05-30 LAB — CBC WITH DIFFERENTIAL/PLATELET
Basophils Absolute: 0 10*3/uL (ref 0.0–0.1)
Basophils Relative: 0.9 % (ref 0.0–3.0)
Eosinophils Absolute: 0.1 10*3/uL (ref 0.0–0.7)
Eosinophils Relative: 4.3 % (ref 0.0–5.0)
HCT: 40.4 % (ref 39.0–52.0)
Hemoglobin: 12.9 g/dL — ABNORMAL LOW (ref 13.0–17.0)
Lymphocytes Relative: 46 % (ref 12.0–46.0)
Lymphs Abs: 1.4 10*3/uL (ref 0.7–4.0)
MCHC: 31.8 g/dL (ref 30.0–36.0)
MCV: 81.9 fl (ref 78.0–100.0)
Monocytes Absolute: 0.4 10*3/uL (ref 0.1–1.0)
Monocytes Relative: 11.7 % (ref 3.0–12.0)
Neutro Abs: 1.2 10*3/uL — ABNORMAL LOW (ref 1.4–7.7)
Neutrophils Relative %: 37.1 % — ABNORMAL LOW (ref 43.0–77.0)
Platelets: 169 10*3/uL (ref 150.0–400.0)
RBC: 4.93 Mil/uL (ref 4.22–5.81)
RDW: 14 % (ref 11.5–15.5)
WBC: 3.1 10*3/uL — ABNORMAL LOW (ref 4.0–10.5)

## 2023-05-30 LAB — BASIC METABOLIC PANEL
BUN: 14 mg/dL (ref 6–23)
CO2: 29 mEq/L (ref 19–32)
Calcium: 9.3 mg/dL (ref 8.4–10.5)
Chloride: 104 mEq/L (ref 96–112)
Creatinine, Ser: 1.16 mg/dL (ref 0.40–1.50)
GFR: 62.44 mL/min (ref >60.00)
Glucose, Bld: 117 mg/dL — ABNORMAL HIGH (ref 70–99)
Potassium: 5 mEq/L (ref 3.5–5.1)
Sodium: 138 mEq/L (ref 135–145)

## 2023-05-30 LAB — URINALYSIS, ROUTINE W REFLEX MICROSCOPIC
Bilirubin Urine: NEGATIVE
Hgb urine dipstick: NEGATIVE
Ketones, ur: NEGATIVE
Leukocytes,Ua: NEGATIVE
Nitrite: NEGATIVE
RBC / HPF: NONE SEEN (ref 0–?)
Specific Gravity, Urine: 1.02 (ref 1.000–1.030)
Total Protein, Urine: NEGATIVE
Urine Glucose: NEGATIVE
Urobilinogen, UA: 0.2 (ref 0.0–1.0)
pH: 6 (ref 5.0–8.0)

## 2023-05-30 LAB — TSH: TSH: 1.53 u[IU]/mL (ref 0.35–5.50)

## 2023-05-30 LAB — LIPID PANEL
Cholesterol: 206 mg/dL — ABNORMAL HIGH (ref 0–200)
HDL: 90.7 mg/dL (ref >39.00)
LDL Cholesterol: 103 mg/dL — ABNORMAL HIGH (ref 0–99)
NonHDL: 115.6
Total CHOL/HDL Ratio: 2
Triglycerides: 63 mg/dL (ref 0.0–149.0)
VLDL: 12.6 mg/dL (ref 0.0–40.0)

## 2023-05-30 LAB — PSA: PSA: 0.62 ng/mL (ref 0.10–4.00)

## 2023-05-30 LAB — CORTISOL: Cortisol, Plasma: 8 ug/dL

## 2023-05-30 NOTE — Patient Instructions (Signed)
Health Maintenance, Male Adopting a healthy lifestyle and getting preventive care are important in promoting health and wellness. Ask your health care provider about: The right schedule for you to have regular tests and exams. Things you can do on your own to prevent diseases and keep yourself healthy. What should I know about diet, weight, and exercise? Eat a healthy diet  Eat a diet that includes plenty of vegetables, fruits, low-fat dairy products, and lean protein. Do not eat a lot of foods that are high in solid fats, added sugars, or sodium. Maintain a healthy weight Body mass index (BMI) is a measurement that can be used to identify possible weight problems. It estimates body fat based on height and weight. Your health care provider can help determine your BMI and help you achieve or maintain a healthy weight. Get regular exercise Get regular exercise. This is one of the most important things you can do for your health. Most adults should: Exercise for at least 150 minutes each week. The exercise should increase your heart rate and make you sweat (moderate-intensity exercise). Do strengthening exercises at least twice a week. This is in addition to the moderate-intensity exercise. Spend less time sitting. Even light physical activity can be beneficial. Watch cholesterol and blood lipids Have your blood tested for lipids and cholesterol at 73 years of age, then have this test every 5 years. You may need to have your cholesterol levels checked more often if: Your lipid or cholesterol levels are high. You are older than 73 years of age. You are at high risk for heart disease. What should I know about cancer screening? Many types of cancers can be detected early and may often be prevented. Depending on your health history and family history, you may need to have cancer screening at various ages. This may include screening for: Colorectal cancer. Prostate cancer. Skin cancer. Lung  cancer. What should I know about heart disease, diabetes, and high blood pressure? Blood pressure and heart disease High blood pressure causes heart disease and increases the risk of stroke. This is more likely to develop in people who have high blood pressure readings or are overweight. Talk with your health care provider about your target blood pressure readings. Have your blood pressure checked: Every 3-5 years if you are 18-39 years of age. Every year if you are 40 years old or older. If you are between the ages of 65 and 75 and are a current or former smoker, ask your health care provider if you should have a one-time screening for abdominal aortic aneurysm (AAA). Diabetes Have regular diabetes screenings. This checks your fasting blood sugar level. Have the screening done: Once every three years after age 45 if you are at a normal weight and have a low risk for diabetes. More often and at a younger age if you are overweight or have a high risk for diabetes. What should I know about preventing infection? Hepatitis B If you have a higher risk for hepatitis B, you should be screened for this virus. Talk with your health care provider to find out if you are at risk for hepatitis B infection. Hepatitis C Blood testing is recommended for: Everyone born from 1945 through 1965. Anyone with known risk factors for hepatitis C. Sexually transmitted infections (STIs) You should be screened each year for STIs, including gonorrhea and chlamydia, if: You are sexually active and are younger than 73 years of age. You are older than 73 years of age and your   health care provider tells you that you are at risk for this type of infection. Your sexual activity has changed since you were last screened, and you are at increased risk for chlamydia or gonorrhea. Ask your health care provider if you are at risk. Ask your health care provider about whether you are at high risk for HIV. Your health care provider  may recommend a prescription medicine to help prevent HIV infection. If you choose to take medicine to prevent HIV, you should first get tested for HIV. You should then be tested every 3 months for as long as you are taking the medicine. Follow these instructions at home: Alcohol use Do not drink alcohol if your health care provider tells you not to drink. If you drink alcohol: Limit how much you have to 0-2 drinks a day. Know how much alcohol is in your drink. In the [place] one drink equals one 12 oz bottle of beer (355 mL), one 5 oz glass of wine (148 mL), or one 1 oz glass of hard liquor (44 mL). Lifestyle Do not use any products that contain nicotine or tobacco. These products include cigarettes, chewing tobacco, and vaping devices, such as e-cigarettes. If you need help quitting, ask your health care provider. Do not use street drugs. Do not share needles. Ask your health care provider for help if you need support or information about quitting drugs. General instructions Schedule regular health, dental, and eye exams. Stay current with your vaccines. Tell your health care provider if: You often feel depressed. You have ever been abused or do not feel safe at home. Summary Adopting a healthy lifestyle and getting preventive care are important in promoting health and wellness. Follow your health care provider's instructions about healthy diet, exercising, and getting tested or screened for diseases. Follow your health care provider's instructions on monitoring your cholesterol and blood pressure. This information is not intended to replace advice given to you by your health care provider. Make sure you discuss any questions you have with your health care provider. Document Revised: 02/15/2021 Document Reviewed: 02/15/2021 Elsevier Patient Education  2024 [employer]  

## 2023-05-30 NOTE — Progress Notes (Signed)
Subjective:  Patient ID: Tanner Thompson, male    DOB: Apr 09, 1951  Age: 72 y.o. MRN: [id number]  CC: Annual Exam, Anemia, and Hyperlipidemia   HPI Tanner Thompson presents for a CPX and f/up ----  He is active and denies chest pain, shortness of breath, diaphoresis, dizziness, lightheadedness, or near syncope.  He complains of nocturia but denies weak urine stream, dysuria, or hematuria.  Outpatient Medications Prior to Visit  Medication Sig Dispense Refill   brimonidine-timolol (COMBIGAN) 0.2-0.5 % ophthalmic solution Place 1 drop into both eyes every 12 (twelve) hours.     cholecalciferol 25 MCG (1000 UT) tablet Take by mouth.     Coenzyme Q10 (CO Q 10 PO) Take 1 tablet by mouth as needed.     Ferrous Sulfate (IRON PO) Take 1 tablet by mouth as needed.     MAGNESIUM PO Take 1 tablet by mouth as needed.      No facility-administered medications prior to visit.    ROS Review of Systems  Constitutional: Negative.  Negative for appetite change, diaphoresis, fatigue and unexpected weight change.  HENT: Negative.    Eyes: Negative.   Respiratory: Negative.  Negative for cough, chest tightness, shortness of breath and wheezing.   Cardiovascular:  Negative for chest pain, palpitations and leg swelling.  Gastrointestinal:  Negative for abdominal pain, constipation, diarrhea and vomiting.  Genitourinary:  Negative for difficulty urinating, dysuria, frequency, scrotal swelling and urgency.  Musculoskeletal: Negative.  Negative for arthralgias and myalgias.  Skin: Negative.   Neurological:  Negative for dizziness, weakness and light-headedness.  Hematological:  Negative for adenopathy. Does not bruise/bleed easily.  Psychiatric/Behavioral: Negative.      Objective:  BP 104/64 (BP Location: Right Arm, Patient Position: Sitting, Cuff Size: Normal)   Pulse 60   Temp 98 F (36.7 C) (Oral)   Resp 16   Ht 5' 7.5" (1.715 m)   Wt 163 lb (73.9 kg)   SpO2 98%   BMI 25.15 kg/m   BP  Readings from Last 3 Encounters:  06/02/23 111/72  05/30/23 104/64  04/26/23 119/76    Wt Readings from Last 3 Encounters:  06/02/23 163 lb (73.9 kg)  05/30/23 163 lb (73.9 kg)  02/01/23 166 lb (75.3 kg)    Physical Exam Vitals reviewed.  Constitutional:      Appearance: Normal appearance.  HENT:     Nose: Nose normal.     Mouth/Throat:     Mouth: Mucous membranes are moist.  Eyes:     General: No scleral icterus.    Conjunctiva/sclera: Conjunctivae normal.  Cardiovascular:     Rate and Rhythm: Regular rhythm. Bradycardia present.     Heart sounds: No murmur heard.    No friction rub. No gallop.     Comments: EKG- SB, 57 bpm No LVH, Q waves, or ST/T waves  Pulmonary:     Effort: Pulmonary effort is normal.     Breath sounds: No stridor. No wheezing, rhonchi or rales.  Abdominal:     General: Abdomen is flat.     Palpations: There is no mass.     Tenderness: There is no abdominal tenderness. There is no guarding.     Hernia: No hernia is present. There is no hernia in the left inguinal area or right inguinal area.  Genitourinary:    Pubic Area: No rash.      Penis: Normal and uncircumcised.      Testes: Normal.     Epididymis:  Right: Normal.     Left: Normal.     Prostate: Enlarged. Not tender and no nodules present.     Rectum: Normal. Guaiac result negative. No mass, tenderness, anal fissure, external hemorrhoid or internal hemorrhoid. Normal anal tone.  Musculoskeletal:        General: No swelling. Normal range of motion.     Cervical back: Neck supple.     Right lower leg: No edema.     Left lower leg: No edema.  Lymphadenopathy:     Cervical: No cervical adenopathy.     Lower Body: No right inguinal adenopathy. No left inguinal adenopathy.  Skin:    General: Skin is warm and dry.  Neurological:     General: No focal deficit present.     Mental Status: He is alert. Mental status is at baseline.  Psychiatric:        Mood and Affect: Mood normal.         Behavior: Behavior normal.     Lab Results  Component Value Date   WBC 3.1 (L) 05/30/2023   HGB 12.9 (L) 05/30/2023   HCT 40.4 05/30/2023   PLT 169.0 05/30/2023   GLUCOSE 117 (H) 05/30/2023   CHOL 206 (H) 05/30/2023   TRIG 63.0 05/30/2023   HDL 90.70 05/30/2023   LDLCALC 103 (H) 05/30/2023   ALT 15 05/30/2023   AST 17 05/30/2023   NA 138 05/30/2023   K 5.0 05/30/2023   CL 104 05/30/2023   CREATININE 1.16 05/30/2023   BUN 14 05/30/2023   CO2 29 05/30/2023   TSH 1.53 05/30/2023   PSA 0.62 05/30/2023   HGBA1C 5.7 04/26/2023    CT Chest W Contrast  Result Date: 12/29/2021 CLINICAL DATA:  Intercostal pain, anemia, weight loss. EXAM: CT CHEST WITH CONTRAST TECHNIQUE: Multidetector CT imaging of the chest was performed during intravenous contrast administration. RADIATION DOSE REDUCTION: This exam was performed according to the departmental dose-optimization program which includes automated exposure control, adjustment of the mA and/or kV according to patient size and/or use of iterative reconstruction technique. CONTRAST:  70mL OMNIPAQUE IOHEXOL 300 MG/ML  SOLN COMPARISON:  Chest x-ray 12/01/2021 FINDINGS: Cardiovascular: No significant vascular findings. Normal heart size. No pericardial effusion. Mediastinum/Nodes: No enlarged mediastinal, hilar, or axillary lymph nodes. Thyroid gland, trachea, and esophagus demonstrate no significant findings. Lungs/Pleura: Lungs are clear. No pleural effusion or pneumothorax. Upper Abdomen: No acute abnormality. Musculoskeletal: No chest wall abnormality. No acute or significant osseous findings. IMPRESSION: No active cardiopulmonary disease. Electronically Signed   By: Darliss Cheney M.D.   On: 12/29/2021 17:40    Assessment & Plan:   Encounter for general adult medical examination with abnormal findings -- Exam completed, labs reviewed, vaccines reviewed and updated, cancer screenings addressed, pt ed material was given.   Hyperlipidemia LDL  goal <130- Statin is not indicated. -     Lipid panel; Future -     TSH; Future -     Hepatic function panel; Future  Bradycardia- He is asymptomatic with this. -     TSH; Future -     EKG 12-Lead  Stage 3a chronic kidney disease (HCC)- His renal function is stable. -     Basic metabolic panel; Future -     CBC with Differential/Platelet; Future -     Urinalysis, Routine w reflex microscopic; Future  Benign prostatic hyperplasia with weak urinary stream -     PSA; Future -     Urinalysis, Routine w reflex microscopic; Future  Idiopathic hypotension- Other than mild anemia his labs are negative for secondary causes. -     Cortisol; Future  Anemia due to acquired thiamine deficiency -     Vitamin B-1; Take 1 tablet (50 mg total) by mouth daily.  Dispense: 90 tablet; Refill: 0     Follow-up: Return in about 6 months (around 11/30/2023).  Sanda Linger, MD

## 2023-06-01 ENCOUNTER — Other Ambulatory Visit (HOSPITAL_COMMUNITY): Payer: Self-pay

## 2023-06-01 ENCOUNTER — Other Ambulatory Visit: Payer: Self-pay

## 2023-06-01 MED ORDER — BRIMONIDINE TARTRATE-TIMOLOL 0.2-0.5 % OP SOLN
1.0000 [drp] | Freq: Two times a day (BID) | OPHTHALMIC | 3 refills | Status: DC
  Filled 2023-06-01: qty 15, 75d supply, fill #0
  Filled 2023-08-11: qty 15, 75d supply, fill #1
  Filled 2023-12-28 (×2): qty 15, 75d supply, fill #2
  Filled 2024-03-18: qty 15, 75d supply, fill #3

## 2023-06-01 NOTE — Progress Notes (Signed)
The patient attended 04/26/23 screening event where his screening results were wnl. His A1c was 5.7 which is just .1 over the normal range. An inbasket has been sent to the pcp informing him of results. At the event the patient noted he has a pcp and insurance with no SDOH insecurities. Per chart review pt has a pcp and the last office visit was 05/30/23. Chart review also indicated an appt for today 06/01/23. No additional Health [employer] team support indicated at this time.

## 2023-06-02 ENCOUNTER — Other Ambulatory Visit (HOSPITAL_COMMUNITY): Payer: Self-pay

## 2023-06-02 ENCOUNTER — Ambulatory Visit (INDEPENDENT_AMBULATORY_CARE_PROVIDER_SITE_OTHER): Payer: HMO | Admitting: *Deleted

## 2023-06-02 ENCOUNTER — Other Ambulatory Visit: Payer: Self-pay

## 2023-06-02 VITALS — BP 111/72 | Ht 67.5 in | Wt 163.0 lb

## 2023-06-02 DIAGNOSIS — Z0000 Encounter for general adult medical examination without abnormal findings: Secondary | ICD-10-CM

## 2023-06-02 MED ORDER — VITAMIN B-1 50 MG PO TABS
50.0000 mg | ORAL_TABLET | Freq: Every day | ORAL | 0 refills | Status: DC
Start: 2023-06-02 — End: 2024-06-04

## 2023-06-02 NOTE — Patient Instructions (Addendum)
Tanner Thompson , Thank you for taking time to come for your Medicare Wellness Visit. I appreciate your ongoing commitment to your health goals. Please review the following plan we discussed and let me know if I can assist you in the future.   These are the goals we discussed:  Goals       My goal is to lose 5 pounds.      Patient Stated (pt-stated)      Patient states his goals are to lower his A1C, to exercise more, improve his health overall        This is a list of the screening recommended for you and due dates:  Health Maintenance  Topic Date Due   COVID-19 Vaccine (7 - 2023-24 season) 11/14/2022   Flu Shot  01/08/2024*   Medicare Annual Wellness Visit  06/01/2024   Colon Cancer Screening  01/31/2026   DTaP/Tdap/Td vaccine (2 - Td or Tdap) 08/18/2027   Pneumonia Vaccine  Completed   Hepatitis C Screening  Completed   Zoster (Shingles) Vaccine  Completed   HPV Vaccine  Aged Out  *Topic was postponed. The date shown is not the original due date.        Health Maintenance, Male Adopting a healthy lifestyle and getting preventive care are important in promoting health and wellness. Ask your health care provider about: The right schedule for you to have regular tests and exams. Things you can do on your own to prevent diseases and keep yourself healthy. What should I know about diet, weight, and exercise? Eat a healthy diet  Eat a diet that includes plenty of vegetables, fruits, low-fat dairy products, and lean protein. Do not eat a lot of foods that are high in solid fats, added sugars, or sodium. Maintain a healthy weight Body mass index (BMI) is a measurement that can be used to identify possible weight problems. It estimates body fat based on height and weight. Your health care provider can help determine your BMI and help you achieve or maintain a healthy weight. Get regular exercise Get regular exercise. This is one of the most important things you can do for your  health. Most adults should: Exercise for at least 150 minutes each week. The exercise should increase your heart rate and make you sweat (moderate-intensity exercise). Do strengthening exercises at least twice a week. This is in addition to the moderate-intensity exercise. Spend less time sitting. Even light physical activity can be beneficial. Watch cholesterol and blood lipids Have your blood tested for lipids and cholesterol at 73 years of age, then have this test every 5 years. You may need to have your cholesterol levels checked more often if: Your lipid or cholesterol levels are high. You are older than 73 years of age. You are at high risk for heart disease. What should I know about cancer screening? Many types of cancers can be detected early and may often be prevented. Depending on your health history and family history, you may need to have cancer screening at various ages. This may include screening for: Colorectal cancer. Prostate cancer. Skin cancer. Lung cancer. What should I know about heart disease, diabetes, and high blood pressure? Blood pressure and heart disease High blood pressure causes heart disease and increases the risk of stroke. This is more likely to develop in people who have high blood pressure readings or are overweight. Talk with your health care provider about your target blood pressure readings. Have your blood pressure checked: Every 3-5 years  if you are 65-26 years of age. Every year if you are 87 years old or older. If you are between the ages of 33 and 41 and are a current or former smoker, ask your health care provider if you should have a one-time screening for abdominal aortic aneurysm (AAA). Diabetes Have regular diabetes screenings. This checks your fasting blood sugar level. Have the screening done: Once every three years after age 77 if you are at a normal weight and have a low risk for diabetes. More often and at a younger age if you are  overweight or have a high risk for diabetes. What should I know about preventing infection? Hepatitis B If you have a higher risk for hepatitis B, you should be screened for this virus. Talk with your health care provider to find out if you are at risk for hepatitis B infection. Hepatitis C Blood testing is recommended for: Everyone born from [birth year] through 1966. Anyone with known risk factors for hepatitis C. Sexually transmitted infections (STIs) You should be screened each year for STIs, including gonorrhea and chlamydia, if: You are sexually active and are younger than 73 years of age. You are older than 73 years of age and your health care provider tells you that you are at risk for this type of infection. Your sexual activity has changed since you were last screened, and you are at increased risk for chlamydia or gonorrhea. Ask your health care provider if you are at risk. Ask your health care provider about whether you are at high risk for HIV. Your health care provider may recommend a prescription medicine to help prevent HIV infection. If you choose to take medicine to prevent HIV, you should first get tested for HIV. You should then be tested every 3 months for as long as you are taking the medicine. Follow these instructions at home: Alcohol use Do not drink alcohol if your health care provider tells you not to drink. If you drink alcohol: Limit how much you have to 0-2 drinks a day. Know how much alcohol is in your drink. In the [place] one drink equals one 12 oz bottle of beer (355 mL), one 5 oz glass of wine (148 mL), or one 1 oz glass of hard liquor (44 mL). Lifestyle Do not use any products that contain nicotine or tobacco. These products include cigarettes, chewing tobacco, and vaping devices, such as e-cigarettes. If you need help quitting, ask your health care provider. Do not use street drugs. Do not share needles. Ask your health care provider for help if you need support or  information about quitting drugs. General instructions Schedule regular health, dental, and eye exams. Stay current with your vaccines. Tell your health care provider if: You often feel depressed. You have ever been abused or do not feel safe at home. Summary Adopting a healthy lifestyle and getting preventive care are important in promoting health and wellness. Follow your health care provider's instructions about healthy diet, exercising, and getting tested or screened for diseases. Follow your health care provider's instructions on monitoring your cholesterol and blood pressure. This information is not intended to replace advice given to you by your health care provider. Make sure you discuss any questions you have with your health care provider. Document Revised: 02/15/2021 Document Reviewed: 02/15/2021 Elsevier Patient Education  2024 [employer].

## 2023-06-02 NOTE — Progress Notes (Signed)
Subjective:   Tanner Thompson is a 73 y.o. male who presents for Medicare Annual/Subsequent preventive examination.  Visit Complete: Virtual  I connected with  Paul Dykes on 06/02/23 by a audio enabled telemedicine application and verified that I am speaking with the correct person using two identifiers.  Patient Location: Home  Provider Location: Office/Clinic  I discussed the limitations of evaluation and management by telemedicine. The patient expressed understanding and agreed to proceed.  Patient Medicare AWV questionnaire was completed by the patient on 06/01/2023; I have confirmed that all information answered by patient is correct and no changes since this date.  Review of Systems    Defer to PCP  Cardiac Risk Factors include: advanced age (>93men, >76 women);male gender    Please see problem list for additional risk factors  Vital Signs: Vital signs are patient reported.  Objective:    Today's Vitals   06/02/23 1022  BP: 111/72  Weight: 163 lb (73.9 kg)  Height: 5' 7.5" (1.715 m)   Body mass index is 25.15 kg/m.     06/02/2023   10:34 AM 07/18/2022    9:09 AM 06/17/2021    1:05 PM 04/09/2019    8:18 AM 04/06/2018   11:22 AM 12/23/2016    6:37 PM  Advanced Directives  Does Patient Have a Medical Advance Directive? No Yes No No No No  Type of Special educational needs teacher of [place];Living will      Copy of Healthcare Power of Attorney in Chart?  No - copy requested      Would patient like information on creating a medical advance directive? No - Patient declined  Yes (MAU/Ambulatory/Procedural Areas - Information given) Yes (ED - Information included in AVS) Yes (ED - Information included in AVS) Yes (ED - Information included in AVS)    Current Medications (verified) Outpatient Encounter Medications as of 06/02/2023  Medication Sig   brimonidine-timolol (COMBIGAN) 0.2-0.5 % ophthalmic solution Place 1 drop into both eyes every 12 (twelve) hours.    brimonidine-timolol (COMBIGAN) 0.2-0.5 % ophthalmic solution Place 1 drop into both eyes every 12 (twelve) hours.   cholecalciferol 25 MCG (1000 UT) tablet Take by mouth.   Coenzyme Q10 (CO Q 10 PO) Take 1 tablet by mouth as needed.   Ferrous Sulfate (IRON PO) Take 1 tablet by mouth as needed.   No facility-administered encounter medications on file as of 06/02/2023.    Allergies (verified) Patient has no known allergies.   History: Past Medical History:  Diagnosis Date   Diplopia    Glaucoma    Pancytopenia (HCC) 12/28/2016   Past Surgical History:  Procedure Laterality Date   CATARACT EXTRACTION Bilateral    One eye laser removal, other removed about 15 yr ago   HEMORROIDECTOMY     Family History  Problem Relation Age of Onset   Breast cancer Mother    Prostate cancer Father    Colon cancer Neg Hx    Rectal cancer Neg Hx    Stomach cancer Neg Hx    Social History   Socioeconomic History   Marital status: Married    Spouse name: Steward Drone   Number of children: 2   Years of education: 12   Highest education level: 12th grade  Occupational History   Occupation: Retired - Midwife   Tobacco Use   Smoking status: Never   Smokeless tobacco: Never  Vaping Use   Vaping status: Never Used  Substance and Sexual Activity  Alcohol use: Yes    Comment: Occasional   Drug use: No   Sexual activity: Yes  Other Topics Concern   Not on file  Social History Narrative   Lives with spouse   Fun: Reading, art, physical activity   Social Determinants of Health   Financial Resource Strain: Low Risk  (06/02/2023)   Overall Financial Resource Strain (CARDIA)    Difficulty of Paying Living Expenses: Not very hard  Food Insecurity: No Food Insecurity (06/02/2023)   Hunger Vital Sign    Worried About Running Out of Food in the Last Year: Never true    Ran Out of Food in the Last Year: Never true  Transportation Needs: No Transportation Needs (06/02/2023)   PRAPARE - Therapist, art (Medical): No    Lack of Transportation (Non-Medical): No  Physical Activity: Sufficiently Active (06/02/2023)   Exercise Vital Sign    Days of Exercise per Week: 3 days    Minutes of Exercise per Session: 50 min  Stress: No Stress Concern Present (06/02/2023)   [employer] of Occupational Health - Occupational Stress Questionnaire    Feeling of Stress : Only a little  Social Connections: Moderately Integrated (06/02/2023)   Social Connection and Isolation Panel [NHANES]    Frequency of Communication with Friends and Family: Once a week    Frequency of Social Gatherings with Friends and Family: Once a week    Attends Religious Services: More than 4 times per year    Active Member of [employer] or Organizations: Yes    Attends Engineer, structural: More than 4 times per year    Marital Status: Married    Tobacco Counseling Counseling given: Not Answered   Clinical Intake:  Pre-visit preparation completed: Yes  Pain : No/denies pain     Nutritional Status: BMI 25 -29 Overweight Nutritional Risks: None Diabetes: No  How often do you need to have someone help you when you read instructions, pamphlets, or other written materials from your doctor or pharmacy?: 1 - Never What is the last grade level you completed in school?: 12 th grade  Interpreter Needed?: No      Activities of Daily Living    06/02/2023   10:34 AM 06/02/2023   10:32 AM  In your present state of health, do you have any difficulty performing the following activities:  Hearing?  0  Vision? 0   Difficulty concentrating or making decisions? 0   Walking or climbing stairs? 0   Dressing or bathing? 0   Doing errands, shopping? 0   Preparing Food and eating ? N   Using the Toilet? N   Do you have problems with loss of bowel control? N   Managing your Medications? N   Housekeeping or managing your Housekeeping? N     Patient Care Team: Etta Grandchild, MD as PCP -  General (Internal Medicine) York Spaniel, MD (Inactive) as Consulting Physician (Neurology) Bradly Bienenstock, MD as Consulting Physician (Orthopedic Surgery) Genia Del Daisy Blossom, MD as Consulting Physician (Ophthalmology)  Indicate any recent Medical Services you may have received from other than [facility] providers in the past year (date may be approximate).     Assessment:   This is a routine wellness examination for Lauro.  Hearing/Vision screen Hearing Screening - Comments:: Patient went to ENT , Dr. Osborn Coho Vision Screening - Comments:: Annual eye exam,[facility]  Dietary issues and exercise activities discussed:     Goals Addressed  This Visit's Progress     Patient Stated (pt-stated)        Patient states his goals are to lower his A1C, to exercise more, improve his health overall      Depression Screen    06/02/2023   10:35 AM 06/02/2023   10:31 AM 07/18/2022    8:42 AM 05/30/2022    2:59 PM 06/17/2021    1:26 PM 05/03/2021    8:39 AM 04/09/2020    8:07 AM  PHQ 2/9 Scores  PHQ - 2 Score 0 0 0 0 0 0 0  PHQ- 9 Score   1 3       Fall Risk    06/02/2023   10:32 AM 06/01/2023    6:14 PM 07/18/2022    8:44 AM 05/30/2022    2:59 PM 06/17/2021    1:31 PM  Fall Risk   Falls in the past year? 0 0 0 0 0  Number falls in past yr: 0 0 0 0 0  Injury with Fall? 0 0 0 0 0  Risk for fall due to : No Fall Risks  No Fall Risks No Fall Risks No Fall Risks  Follow up Falls evaluation completed  Falls prevention discussed Falls evaluation completed Falls evaluation completed    MEDICARE RISK AT HOME: Medicare Risk at Home Any stairs in or around the home?: No If so, are there any without handrails?: No Home free of loose throw rugs in walkways, pet beds, electrical cords, etc?: Yes Adequate lighting in your home to reduce risk of falls?: Yes Life alert?: No Use of a cane, walker or w/c?: No Grab bars in the bathroom?: No Shower chair or bench  in shower?: No Elevated toilet seat or a handicapped toilet?: No  TIMED UP AND GO:  Was the test performed?  n/a   Cognitive Function:        06/02/2023   10:36 AM 07/18/2022    8:44 AM  6CIT Screen  What Year? 0 points 0 points  What month? 0 points 0 points  What time? 0 points 0 points  Count back from 20 0 points 0 points  Months in reverse 0 points 0 points  Repeat phrase 0 points 0 points  Total Score 0 points 0 points    Immunizations Immunization History  Administered Date(s) Administered   COVID-19, mRNA, vaccine(Comirnaty)12 years and older 07/14/2022   Fluad Quad(high Dose 65+) 06/08/2019, 06/16/2021, 07/14/2022   Influenza, High Dose Seasonal PF 07/19/2018   PFIZER(Purple Top)SARS-COV-2 Vaccination 11/14/2019, 12/05/2019, 07/28/2020, 01/12/2021   Pfizer Covid-19 Vaccine Bivalent Booster 80yrs & up 07/02/2021   Pneumococcal Conjugate-13 02/20/2017   Pneumococcal Polysaccharide-23 08/13/2015, 05/09/2022, 11/09/2022   Respiratory Syncytial Virus Vaccine,Recomb Aduvanted(Arexvy) 07/14/2022   Tdap 08/17/2017   Zoster Recombinant(Shingrix) 06/05/2017, 09/04/2017    TDAP status: Up to date  Flu Vaccine status: Due, Education has been provided regarding the importance of this vaccine. Advised may receive this vaccine at local pharmacy or Health Dept. Aware to provide a copy of the vaccination record if obtained from local pharmacy or Health Dept. Verbalized acceptance and understanding.  Pneumococcal vaccine status: Up to date  Covid-19 vaccine status: Completed vaccines  Qualifies for Shingles Vaccine? Yes   Zostavax completed Yes   Shingrix Completed?: Yes  Screening Tests Health Maintenance  Topic Date Due   COVID-19 Vaccine (7 - 2023-24 season) 11/14/2022   INFLUENZA VACCINE  01/08/2024 (Originally 05/11/2023)   Medicare Annual Wellness (AWV)  06/01/2024   Colonoscopy  01/31/2026   DTaP/Tdap/Td (2 - Td or Tdap) 08/18/2027   Pneumonia Vaccine 65+ Years  old  Completed   Hepatitis C Screening  Completed   Zoster Vaccines- Shingrix  Completed   HPV VACCINES  Aged Out    Health Maintenance  Health Maintenance Due  Topic Date Due   COVID-19 Vaccine (7 - 2023-24 season) 11/14/2022    Colorectal cancer screening: Type of screening: Colonoscopy. Completed 02/01/2023. Repeat every 3 years  Lung Cancer Screening: (Low Dose CT Chest recommended if Age 23-80 years, 20 pack-year currently smoking OR have quit w/in 15years.) does not qualify.   Lung Cancer Screening Referral: n/a  Additional Screening:  Hepatitis C Screening: does qualify; Completed 02/26/2016  Vision Screening: Recommended annual ophthalmology exams for early detection of glaucoma and other disorders of the eye. Is the patient up to date with their annual eye exam?  Yes  Who is the provider or what is the name of the office in which the patient attends annual eye exams? [facility] If pt is not established with a provider, would they like to be referred to a provider to establish care? No .   Dental Screening: Recommended annual dental exams for proper oral hygiene  Diabetic Foot Exam: n/a  Community Resource Referral / Chronic Care Management: CRR required this visit?  No   CCM required this visit?  No     Plan:     I have personally reviewed and noted the following in the patient's chart:   Medical and social history Use of alcohol, tobacco or illicit drugs  Current medications and supplements including opioid prescriptions. Patient is not currently taking opioid prescriptions. Functional ability and status Nutritional status Physical activity Advanced directives List of other physicians Hospitalizations, surgeries, and ER visits in previous 12 months Vitals Screenings to include cognitive, depression, and falls Referrals and appointments  In addition, I have reviewed and discussed with patient certain preventive protocols, quality metrics, and  best practice recommendations. A written personalized care plan for preventive services as well as general preventive health recommendations were provided to patient.     Tamela Oddi, CMA   06/02/2023  Non face to face 30 min   After Visit Summary: (MyChart) Due to this being a telephonic visit, the after visit summary with patients personalized plan was offered to patient via MyChart   Nurse Notes:  Mr. Cressler , Thank you for taking time to come for your Medicare Wellness Visit. I appreciate your ongoing commitment to your health goals. Please review the following plan we discussed and let me know if I can assist you in the future.   These are the goals we discussed:  Goals       My goal is to lose 5 pounds.      Patient Stated (pt-stated)      Patient states his goals are to lower his A1C, to exercise more, improve his health overall        This is a list of the screening recommended for you and due dates:  Health Maintenance  Topic Date Due   COVID-19 Vaccine (7 - 2023-24 season) 11/14/2022   Flu Shot  01/08/2024*   Medicare Annual Wellness Visit  06/01/2024   Colon Cancer Screening  01/31/2026   DTaP/Tdap/Td vaccine (2 - Td or Tdap) 08/18/2027   Pneumonia Vaccine  Completed   Hepatitis C Screening  Completed   Zoster (Shingles) Vaccine  Completed   HPV Vaccine  Aged Out  *Topic was  postponed. The date shown is not the original due date.

## 2023-07-04 ENCOUNTER — Encounter: Payer: Self-pay | Admitting: Internal Medicine

## 2023-07-07 ENCOUNTER — Ambulatory Visit: Payer: HMO

## 2023-07-11 ENCOUNTER — Ambulatory Visit (INDEPENDENT_AMBULATORY_CARE_PROVIDER_SITE_OTHER): Payer: HMO

## 2023-07-11 DIAGNOSIS — Z23 Encounter for immunization: Secondary | ICD-10-CM

## 2023-07-11 NOTE — Progress Notes (Signed)
Patient presented in office today for his HD Flu vaccine. HD Flu Vaccine was administered into his Left Deltoid Muscle. Patient tolerated injection well and injection site looked fine. Patient advised to report to office immediately if he noticed any adverse reactions.

## 2023-07-12 ENCOUNTER — Encounter: Payer: Self-pay | Admitting: Internal Medicine

## 2023-07-16 ENCOUNTER — Encounter: Payer: Self-pay | Admitting: Plastic Surgery

## 2023-08-12 ENCOUNTER — Other Ambulatory Visit (HOSPITAL_COMMUNITY): Payer: Self-pay

## 2023-08-14 ENCOUNTER — Other Ambulatory Visit: Payer: Self-pay

## 2023-08-14 ENCOUNTER — Other Ambulatory Visit (HOSPITAL_COMMUNITY): Payer: Self-pay

## 2023-08-16 ENCOUNTER — Other Ambulatory Visit (HOSPITAL_COMMUNITY): Payer: Self-pay

## 2023-11-28 DIAGNOSIS — H401131 Primary open-angle glaucoma, bilateral, mild stage: Secondary | ICD-10-CM | POA: Diagnosis not present

## 2023-12-28 ENCOUNTER — Other Ambulatory Visit (HOSPITAL_COMMUNITY): Payer: Self-pay

## 2023-12-29 ENCOUNTER — Other Ambulatory Visit (HOSPITAL_COMMUNITY): Payer: Self-pay

## 2024-02-16 ENCOUNTER — Encounter (HOSPITAL_COMMUNITY): Payer: Self-pay

## 2024-02-23 ENCOUNTER — Ambulatory Visit

## 2024-02-23 VITALS — BP 110/70 | HR 63 | Ht 67.5 in | Wt 163.8 lb

## 2024-02-23 DIAGNOSIS — Z0000 Encounter for general adult medical examination without abnormal findings: Secondary | ICD-10-CM

## 2024-02-23 NOTE — Patient Instructions (Signed)
 Tanner Thompson , Thank you for taking time out of your busy schedule to complete your Annual Wellness Visit with me. I enjoyed our conversation and look forward to speaking with you again next year. I, as well as your care team,  appreciate your ongoing commitment to your health goals. Please review the following plan we discussed and let me know if I can assist you in the future. Your Game plan/ To Do List   Follow up Visits: Next Medicare AWV with our clinical staff: 02/26/2025.   Have you seen your provider in the last 6 months (3 months if uncontrolled diabetes)? Yes Next Office Visit with your provider: 06/04/2024.  Clinician Recommendations:  Aim for 30 minutes of exercise or brisk walking, 6-8 glasses of water, and 5 servings of fruits and vegetables each day. Keep up the good work.      This is a list of the screening recommended for you and due dates:  Health Maintenance  Topic Date Due   COVID-19 Vaccine (8 - 2024-25 season) 09/05/2023   Flu Shot  05/10/2024   Medicare Annual Wellness Visit  02/22/2025   Colon Cancer Screening  01/31/2026   DTaP/Tdap/Td vaccine (2 - Td or Tdap) 08/18/2027   Pneumonia Vaccine  Completed   Hepatitis C Screening  Completed   Zoster (Shingles) Vaccine  Completed   HPV Vaccine  Aged Out   Meningitis B Vaccine  Aged Out    Advanced directives: (Declined) Advance directive discussed with you today. Even though you declined this today, please call our office should you change your mind, and we can give you the proper paperwork for you to fill out. Advance Care Planning is important because it:  [x]  Makes sure you receive the medical care that is consistent with your values, goals, and preferences  [x]  It provides guidance to your family and loved ones and reduces their decisional burden about whether or not they are making the right decisions based on your wishes.  Follow the link provided in your after visit summary or read over the paperwork we have  mailed to you to help you started getting your Advance Directives in place. If you need assistance in completing these, please reach out to us  so that we can help you!  See attachments for Preventive Care and Fall Prevention Tips.

## 2024-02-23 NOTE — Progress Notes (Signed)
 Subjective:   Tanner Thompson is a 73 y.o. who presents for a Medicare Wellness preventive visit.  As a reminder, Annual Wellness Visits don't include a physical exam, and some assessments may be limited, especially if this visit is performed virtually. We may recommend an in-person visit if needed.  Visit Complete: In person  Persons Participating in Visit: Patient.  AWV Questionnaire: Yes: Patient Medicare AWV questionnaire was completed by the patient on 02/18/2024; I have confirmed that all information answered by patient is correct and no changes since this date.  Cardiac Risk Factors include: male gender;advanced age (>62men, >35 women);Other (see comment);dyslipidemia, Risk factor comments: CKD 3a, BPH     Objective:     Today's Vitals   02/23/24 0852  Weight: 163 lb 12.8 oz (74.3 kg)  Height: 5' 7.5" (1.715 m)   Body mass index is 25.28 kg/m.     02/23/2024    8:57 AM 06/02/2023   10:34 AM 07/18/2022    9:09 AM 06/17/2021    1:05 PM 04/09/2019    8:18 AM 04/06/2018   11:22 AM 12/23/2016    6:37 PM  Advanced Directives  Does Patient Have a Medical Advance Directive? No No Yes No No No No  Type of Surveyor, minerals;Living will      Copy of Healthcare Power of Attorney in Chart?   No - copy requested      Would patient like information on creating a medical advance directive?  No - Patient declined  Yes (MAU/Ambulatory/Procedural Areas - Information given) Yes (ED - Information included in AVS) Yes (ED - Information included in AVS) Yes (ED - Information included in AVS)    Current Medications (verified) Outpatient Encounter Medications as of 02/23/2024  Medication Sig   brimonidine -timolol  (COMBIGAN ) 0.2-0.5 % ophthalmic solution Place 1 drop into both eyes every 12 (twelve) hours.   brimonidine -timolol  (COMBIGAN ) 0.2-0.5 % ophthalmic solution Place 1 drop into both eyes every 12 (twelve) hours.   cholecalciferol 25 MCG (1000 UT) tablet  Take by mouth.   Coenzyme Q10 (CO Q 10 PO) Take 1 tablet by mouth as needed.   Ferrous Sulfate (IRON PO) Take 1 tablet by mouth as needed.   thiamine  (VITAMIN B-1) 50 MG tablet Take 1 tablet (50 mg total) by mouth daily.   No facility-administered encounter medications on file as of 02/23/2024.    Allergies (verified) Patient has no known allergies.   History: Past Medical History:  Diagnosis Date   Diplopia    Glaucoma    Pancytopenia (HCC) 12/28/2016   Past Surgical History:  Procedure Laterality Date   CATARACT EXTRACTION Bilateral    One eye laser removal, other removed about 15 yr ago   HEMORROIDECTOMY     Family History  Problem Relation Age of Onset   Breast cancer Mother    Prostate cancer Father    Colon cancer Neg Hx    Rectal cancer Neg Hx    Stomach cancer Neg Hx    Social History   Socioeconomic History   Marital status: Married    Spouse name: Cornelius Dill   Number of children: 2   Years of education: 12   Highest education level: 12th grade  Occupational History   Occupation: Retired - Midwife   Tobacco Use   Smoking status: Never   Smokeless tobacco: Never  Vaping Use   Vaping status: Never Used  Substance and Sexual Activity   Alcohol use: Yes  Comment: Occasional   Drug use: No   Sexual activity: Yes  Other Topics Concern   Not on file  Social History Narrative   Lives with spouse   Fun: Reading, art, physical activity   Social Drivers of Health   Financial Resource Strain: Low Risk  (02/18/2024)   Overall Financial Resource Strain (CARDIA)    Difficulty of Paying Living Expenses: Not very hard  Food Insecurity: No Food Insecurity (02/18/2024)   Hunger Vital Sign    Worried About Running Out of Food in the Last Year: Never true    Ran Out of Food in the Last Year: Never true  Transportation Needs: No Transportation Needs (02/18/2024)   PRAPARE - Administrator, Civil Service (Medical): No    Lack of Transportation  (Non-Medical): No  Physical Activity: Sufficiently Active (02/18/2024)   Exercise Vital Sign    Days of Exercise per Week: 5 days    Minutes of Exercise per Session: 40 min  Stress: No Stress Concern Present (02/18/2024)   [employer] of Occupational Health - Occupational Stress Questionnaire    Feeling of Stress : Only a little  Social Connections: Socially Integrated (02/18/2024)   Social Connection and Isolation Panel [NHANES]    Frequency of Communication with Friends and Family: Twice a week    Frequency of Social Gatherings with Friends and Family: Twice a week    Attends Religious Services: More than 4 times per year    Active Member of [employer] or Organizations: Yes    Attends Engineer, structural: More than 4 times per year    Marital Status: Married    Tobacco Counseling Counseling given: Not Answered    Clinical Intake:  Pre-visit preparation completed: Yes  Pain : No/denies pain     BMI - recorded: 25.28 Nutritional Status: BMI 25 -29 Overweight Nutritional Risks: None Diabetes: No  Lab Results  Component Value Date   HGBA1C 5.7 04/26/2023     How often do you need to have someone help you when you read instructions, pamphlets, or other written materials from your doctor or pharmacy?: 1 - Never  Interpreter Needed?: No  Information entered by :: Garnetta Fedrick, RMA   Activities of Daily Living     02/18/2024    5:23 PM 06/02/2023   10:34 AM  In your present state of health, do you have any difficulty performing the following activities:  Hearing? 0   Vision? 0 0  Difficulty concentrating or making decisions?  0  Walking or climbing stairs? 0 0  Dressing or bathing? 0 0  Doing errands, shopping? 0 0  Preparing Food and eating ? N N  Using the Toilet? N N  In the past six months, have you accidently leaked urine? N   Do you have problems with loss of bowel control? N N  Managing your Medications? N N  Managing your Finances? N    Housekeeping or managing your Housekeeping? N N    Patient Care Team: Arcadio Knuckles, MD as PCP - General (Internal Medicine) Brian Campanile, MD (Inactive) as Consulting Physician (Neurology) Arvil Birks, MD as Consulting Physician (Orthopedic Surgery) [place] At[place] Scot Cutter, MD as Consulting Physician (Ophthalmology)  Indicate any recent Medical Services you may have received from other than [facility] providers in the past year (date may be approximate).     Assessment:    This is a routine wellness examination for Gerardus.  Hearing/Vision screen Hearing Screening - Comments:: Denies hearing  difficulties   Vision Screening - Comments:: Wears eyeglasses/ Dr. Hulon Magic   Goals Addressed               This Visit's Progress     Patient Stated (pt-stated)   On track     Patient states his goals are to lower his A1C, to exercise more, improve his health overall       Depression Screen     02/23/2024    8:58 AM 06/02/2023   10:35 AM 06/02/2023   10:31 AM 07/18/2022    8:42 AM 05/30/2022    2:59 PM 06/17/2021    1:26 PM 05/03/2021    8:39 AM  PHQ 2/9 Scores  PHQ - 2 Score 0 0 0 0 0 0 0  PHQ- 9 Score 0   1 3      Fall Risk     02/18/2024    5:23 PM 06/02/2023   10:32 AM 06/01/2023    6:14 PM 07/18/2022    8:44 AM 05/30/2022    2:59 PM  Fall Risk   Falls in the past year? 0 0 0 0 0  Number falls in past yr: 0 0 0 0 0  Injury with Fall? 0 0 0 0 0  Risk for fall due to :  No Fall Risks  No Fall Risks No Fall Risks  Follow up Falls evaluation completed;Falls prevention discussed Falls evaluation completed  Falls prevention discussed Falls evaluation completed    MEDICARE RISK AT HOME:  Medicare Risk at Home Any stairs in or around the home?: (Patient-Rptd) Yes If so, are there any without handrails?: (Patient-Rptd) Yes Home free of loose throw rugs in walkways, pet beds, electrical cords, etc?: (Patient-Rptd) No Adequate lighting in your home to reduce risk of falls?:  (Patient-Rptd) Yes Life alert?: (Patient-Rptd) No Use of a cane, walker or w/c?: (Patient-Rptd) No Grab bars in the bathroom?: (Patient-Rptd) No Shower chair or bench in shower?: (Patient-Rptd) No Elevated toilet seat or a handicapped toilet?: (Patient-Rptd) No  TIMED UP AND GO:  Was the test performed?  Yes  Length of time to ambulate 10 feet: 10 sec Gait steady and fast without use of assistive device  Cognitive Function: Declined/Normal: No cognitive concerns noted by patient or family. Patient alert, oriented, able to answer questions appropriately and recall recent events. No signs of memory loss or confusion.        06/02/2023   10:36 AM 07/18/2022    8:44 AM  6CIT Screen  What Year? 0 points 0 points  What month? 0 points 0 points  What time? 0 points 0 points  Count back from 20 0 points 0 points  Months in reverse 0 points 0 points  Repeat phrase 0 points 0 points  Total Score 0 points 0 points    Immunizations Immunization History  Administered Date(s) Administered   Fluad Quad(high Dose 65+) 06/08/2019, 06/16/2021, 07/14/2022   Fluad Trivalent(High Dose 65+) 07/11/2023   Influenza, High Dose Seasonal PF 07/19/2018   PFIZER(Purple Top)SARS-COV-2 Vaccination 11/14/2019, 12/05/2019, 07/28/2020, 01/12/2021, 07/11/2023   Pfizer Covid-19 Vaccine Bivalent Booster 79yrs & up 07/02/2021   Pfizer(Comirnaty)Fall Seasonal Vaccine 12 years and older 07/14/2022   Pneumococcal Conjugate-13 02/20/2017   Pneumococcal Polysaccharide-23 08/13/2015, 05/09/2022, 11/09/2022   Respiratory Syncytial Virus Vaccine,Recomb Aduvanted(Arexvy) 07/14/2022   Tdap 08/17/2017   Zoster Recombinant(Shingrix) 06/05/2017, 09/04/2017    Screening Tests Health Maintenance  Topic Date Due   COVID-19 Vaccine (8 - 2024-25 season) 09/05/2023   INFLUENZA VACCINE  05/10/2024   Medicare Annual Wellness (AWV)  02/22/2025   Colonoscopy  01/31/2026   DTaP/Tdap/Td (2 - Td or Tdap) 08/18/2027   Pneumonia  Vaccine 83+ Years old  Completed   Hepatitis C Screening  Completed   Zoster Vaccines- Shingrix  Completed   HPV VACCINES  Aged Out   Meningococcal B Vaccine  Aged Out    Health Maintenance  Health Maintenance Due  Topic Date Due   COVID-19 Vaccine (8 - 2024-25 season) 09/05/2023   Health Maintenance Items Addressed: See Nurse Notes  Additional Screening:  Vision Screening: Recommended annual ophthalmology exams for early detection of glaucoma and other disorders of the eye.  Dental Screening: Recommended annual dental exams for proper oral hygiene  Community Resource Referral / Chronic Care Management: CRR required this visit?  No   CCM required this visit?  No   Plan:    I have personally reviewed and noted the following in the patient's chart:   Medical and social history Use of alcohol, tobacco or illicit drugs  Current medications and supplements including opioid prescriptions. Patient is not currently taking opioid prescriptions. Functional ability and status Nutritional status Physical activity Advanced directives List of other physicians Hospitalizations, surgeries, and ER visits in previous 12 months Vitals Screenings to include cognitive, depression, and falls Referrals and appointments  In addition, I have reviewed and discussed with patient certain preventive protocols, quality metrics, and best practice recommendations. A written personalized care plan for preventive services as well as general preventive health recommendations were provided to patient.   Maythe Deramo L Tavionna Grout, CMA   02/23/2024   After Visit Summary: (MyChart) Due to this being a telephonic visit, the after visit summary with patients personalized plan was offered to patient via MyChart   Notes: Nothing significant to report at this time.

## 2024-03-18 ENCOUNTER — Other Ambulatory Visit (HOSPITAL_COMMUNITY): Payer: Self-pay

## 2024-03-18 ENCOUNTER — Encounter (HOSPITAL_COMMUNITY): Payer: Self-pay

## 2024-03-19 ENCOUNTER — Other Ambulatory Visit (HOSPITAL_COMMUNITY): Payer: Self-pay

## 2024-04-15 DIAGNOSIS — H6123 Impacted cerumen, bilateral: Secondary | ICD-10-CM | POA: Diagnosis not present

## 2024-05-27 DIAGNOSIS — H401131 Primary open-angle glaucoma, bilateral, mild stage: Secondary | ICD-10-CM | POA: Diagnosis not present

## 2024-05-28 ENCOUNTER — Encounter: Payer: Self-pay | Admitting: Internal Medicine

## 2024-06-04 ENCOUNTER — Ambulatory Visit: Admitting: Internal Medicine

## 2024-06-04 ENCOUNTER — Ambulatory Visit: Payer: Self-pay | Admitting: Internal Medicine

## 2024-06-04 ENCOUNTER — Encounter: Payer: Self-pay | Admitting: Internal Medicine

## 2024-06-04 VITALS — BP 114/70 | HR 57 | Temp 98.0°F | Resp 16 | Ht 67.5 in | Wt 162.0 lb

## 2024-06-04 DIAGNOSIS — N1831 Chronic kidney disease, stage 3a: Secondary | ICD-10-CM

## 2024-06-04 DIAGNOSIS — R001 Bradycardia, unspecified: Secondary | ICD-10-CM | POA: Diagnosis not present

## 2024-06-04 DIAGNOSIS — N401 Enlarged prostate with lower urinary tract symptoms: Secondary | ICD-10-CM

## 2024-06-04 DIAGNOSIS — Z0000 Encounter for general adult medical examination without abnormal findings: Secondary | ICD-10-CM | POA: Diagnosis not present

## 2024-06-04 DIAGNOSIS — R3912 Poor urinary stream: Secondary | ICD-10-CM

## 2024-06-04 DIAGNOSIS — D538 Other specified nutritional anemias: Secondary | ICD-10-CM | POA: Diagnosis not present

## 2024-06-04 DIAGNOSIS — D539 Nutritional anemia, unspecified: Secondary | ICD-10-CM | POA: Diagnosis not present

## 2024-06-04 DIAGNOSIS — Z0001 Encounter for general adult medical examination with abnormal findings: Secondary | ICD-10-CM

## 2024-06-04 DIAGNOSIS — E519 Thiamine deficiency, unspecified: Secondary | ICD-10-CM

## 2024-06-04 DIAGNOSIS — E785 Hyperlipidemia, unspecified: Secondary | ICD-10-CM | POA: Diagnosis not present

## 2024-06-04 LAB — BASIC METABOLIC PANEL WITH GFR
BUN: 22 mg/dL (ref 6–23)
CO2: 28 meq/L (ref 19–32)
Calcium: 9.1 mg/dL (ref 8.4–10.5)
Chloride: 104 meq/L (ref 96–112)
Creatinine, Ser: 1.16 mg/dL (ref 0.40–1.50)
GFR: 62 mL/min (ref >60.00)
Glucose, Bld: 107 mg/dL — ABNORMAL HIGH (ref 70–99)
Potassium: 4.8 meq/L (ref 3.5–5.1)
Sodium: 139 meq/L (ref 135–145)

## 2024-06-04 LAB — MICROALBUMIN / CREATININE URINE RATIO
Creatinine,U: 137.5 mg/dL
Microalb Creat Ratio: UNDETERMINED mg/g (ref 0.0–30.0)
Microalb, Ur: 0.7 mg/dL (ref 0.0–1.9)

## 2024-06-04 LAB — LIPID PANEL
Cholesterol: 192 mg/dL (ref 0–200)
HDL: 93.9 mg/dL (ref >39.00)
LDL Cholesterol: 84 mg/dL (ref 0–99)
NonHDL: 98.59
Total CHOL/HDL Ratio: 2
Triglycerides: 73 mg/dL (ref 0.0–149.0)
VLDL: 14.6 mg/dL (ref 0.0–40.0)

## 2024-06-04 LAB — CBC WITH DIFFERENTIAL/PLATELET
Basophils Absolute: 0 K/uL (ref 0.0–0.1)
Basophils Relative: 0.7 % (ref 0.0–3.0)
Eosinophils Absolute: 0.1 K/uL (ref 0.0–0.7)
Eosinophils Relative: 3.1 % (ref 0.0–5.0)
HCT: 39.3 % (ref 39.0–52.0)
Hemoglobin: 12.7 g/dL — ABNORMAL LOW (ref 13.0–17.0)
Lymphocytes Relative: 40.5 % (ref 12.0–46.0)
Lymphs Abs: 1.3 K/uL (ref 0.7–4.0)
MCHC: 32.4 g/dL (ref 30.0–36.0)
MCV: 81.2 fl (ref 78.0–100.0)
Monocytes Absolute: 0.4 K/uL (ref 0.1–1.0)
Monocytes Relative: 12 % (ref 3.0–12.0)
Neutro Abs: 1.4 K/uL (ref 1.4–7.7)
Neutrophils Relative %: 43.7 % (ref 43.0–77.0)
Platelets: 151 K/uL (ref 150.0–400.0)
RBC: 4.84 Mil/uL (ref 4.22–5.81)
RDW: 14 % (ref 11.5–15.5)
WBC: 3.1 K/uL — ABNORMAL LOW (ref 4.0–10.5)

## 2024-06-04 LAB — URINALYSIS, ROUTINE W REFLEX MICROSCOPIC
Bilirubin Urine: NEGATIVE
Hgb urine dipstick: NEGATIVE
Ketones, ur: NEGATIVE
Leukocytes,Ua: NEGATIVE
Nitrite: NEGATIVE
RBC / HPF: NONE SEEN (ref 0–?)
Specific Gravity, Urine: 1.02 (ref 1.000–1.030)
Total Protein, Urine: NEGATIVE
Urine Glucose: NEGATIVE
Urobilinogen, UA: 0.2 (ref 0.0–1.0)
WBC, UA: NONE SEEN (ref 0–?)
pH: 6 (ref 5.0–8.0)

## 2024-06-04 LAB — HEPATIC FUNCTION PANEL
ALT: 46 U/L (ref 0–53)
AST: 29 U/L (ref 0–37)
Albumin: 4.2 g/dL (ref 3.5–5.2)
Alkaline Phosphatase: 121 U/L — ABNORMAL HIGH (ref 39–117)
Bilirubin, Direct: 0.2 mg/dL (ref 0.0–0.3)
Total Bilirubin: 0.7 mg/dL (ref 0.2–1.2)
Total Protein: 6.9 g/dL (ref 6.0–8.3)

## 2024-06-04 LAB — IBC + FERRITIN
Ferritin: 185 ng/mL (ref 22.0–322.0)
Iron: 87 ug/dL (ref 42–165)
Saturation Ratios: 23.2 % (ref 20.0–50.0)
TIBC: 375.2 ug/dL (ref 250.0–450.0)
Transferrin: 268 mg/dL (ref 212.0–360.0)

## 2024-06-04 LAB — PSA: PSA: 0.54 ng/mL (ref 0.10–4.00)

## 2024-06-04 LAB — VITAMIN B12: Vitamin B-12: 545 pg/mL (ref 211–911)

## 2024-06-04 LAB — FOLATE: Folate: 14.2 ng/mL (ref >5.9)

## 2024-06-04 LAB — TSH: TSH: 1.14 u[IU]/mL (ref 0.35–5.50)

## 2024-06-04 MED ORDER — EMPAGLIFLOZIN 10 MG PO TABS: 10.0000 mg | ORAL_TABLET | Freq: Every day | ORAL | 1 refills | Status: AC

## 2024-06-04 NOTE — Progress Notes (Signed)
 Subjective:  Patient ID: Tanner Thompson, male    DOB: 04-17-1951  Age: 73 y.o. MRN: [id number]  CC: Annual Exam and Anemia   HPI [name] presents for a CPX and f/up ----  Discussed the use of AI scribe software for clinical note transcription with the patient, who gave verbal consent to proceed.  History of Present Illness Tanner Thompson is a 73 year old male who presents for a routine follow-up visit.  He feels generally well and remains active, participating in an exercise class three times a week and walking daily. No chest pain or shortness of breath during these activities. He has not experienced any changes in weight or appetite and wants to eat less, feeling that his weight is under control since starting to exercise.  He experiences occasional dizziness, particularly when bending down for extended periods, such as looking under a car. However, he has not experienced dizziness severe enough to cause near syncope.  There have been no changes in his medication since the last visit. He continues to use Combigan  and takes vitamin B1 regularly. He takes iron occasionally but not daily.  No signs of blood loss, such as blood in the stool or urine. He reports no trouble urinating, although it takes longer to urinate. No painful urination.     Outpatient Medications Prior to Visit  Medication Sig Dispense Refill   brimonidine -timolol  (COMBIGAN ) 0.2-0.5 % ophthalmic solution Place 1 drop into both eyes every 12 (twelve) hours. 15 mL 3   Coenzyme Q10 (CO Q 10 PO) Take 1 tablet by mouth as needed.     Ferrous Sulfate (IRON PO) Take 1 tablet by mouth as needed.     brimonidine -timolol  (COMBIGAN ) 0.2-0.5 % ophthalmic solution Place 1 drop into both eyes every 12 (twelve) hours.     cholecalciferol 25 MCG (1000 UT) tablet Take by mouth.     thiamine  (VITAMIN B-1) 50 MG tablet Take 1 tablet (50 mg total) by mouth daily. 90 tablet 0   No facility-administered medications  prior to visit.    ROS Review of Systems  Constitutional:  Negative for appetite change, chills, diaphoresis, fatigue and fever.  HENT: Negative.  Negative for sore throat and trouble swallowing.   Eyes: Negative.   Respiratory:  Negative for cough, chest tightness, shortness of breath and wheezing.   Cardiovascular:  Negative for chest pain, palpitations and leg swelling.  Gastrointestinal: Negative.  Negative for abdominal pain, blood in stool, constipation, diarrhea, nausea and vomiting.  Endocrine: Negative.   Genitourinary: Negative.  Negative for difficulty urinating and dysuria.  Musculoskeletal: Negative.  Negative for arthralgias, joint swelling and myalgias.  Skin: Negative.   Neurological:  Positive for dizziness. Negative for weakness, light-headedness and numbness.  Hematological:  Negative for adenopathy. Does not bruise/bleed easily.  Psychiatric/Behavioral: Negative.      Objective:  BP 114/70 (BP Location: Left Arm, Patient Position: Sitting, Cuff Size: Normal)   Pulse (!) 57   Temp 98 F (36.7 C) (Oral)   Resp 16   Ht 5' 7.5 (1.715 m)   Wt 162 lb (73.5 kg)   SpO2 99%   BMI 25.00 kg/m   BP Readings from Last 3 Encounters:  06/04/24 114/70  02/23/24 110/70  06/02/23 111/72    Wt Readings from Last 3 Encounters:  06/04/24 162 lb (73.5 kg)  02/23/24 163 lb 12.8 oz (74.3 kg)  06/02/23 163 lb (73.9 kg)    Physical Exam Vitals reviewed.  Constitutional:  Appearance: Normal appearance.  HENT:     Mouth/Throat:     Mouth: Mucous membranes are moist. Mucous membranes are pale.  Eyes:     General: No scleral icterus.    Conjunctiva/sclera: Conjunctivae normal.  Cardiovascular:     Rate and Rhythm: Regular rhythm. Bradycardia present.     Pulses: Normal pulses.     Heart sounds: No murmur heard.    No friction rub. No gallop.     Comments: EKG--- SB, 56 bpm Early repol No Q waves Unchanged  Pulmonary:     Effort: Pulmonary effort is normal.      Breath sounds: No stridor. No wheezing, rhonchi or rales.  Chest:     Chest wall: No tenderness.  Abdominal:     General: Abdomen is flat.     Palpations: There is no mass.     Tenderness: There is no abdominal tenderness. There is no guarding.     Hernia: No hernia is present. There is no hernia in the left inguinal area or right inguinal area.  Genitourinary:    Pubic Area: No rash.      Penis: Normal and uncircumcised. No phimosis, paraphimosis, hypospadias, erythema, tenderness, discharge, swelling or lesions.      Testes: Normal.     Epididymis:     Right: Normal.     Left: Normal.     Prostate: Normal. Not enlarged, not tender and no nodules present.     Rectum: Normal. Guaiac result negative. No mass, tenderness, anal fissure, external hemorrhoid or internal hemorrhoid. Normal anal tone.  Musculoskeletal:     Right lower leg: No edema.     Left lower leg: No edema.  Lymphadenopathy:     Lower Body: No right inguinal adenopathy. No left inguinal adenopathy.  Skin:    General: Skin is warm.     Coloration: Skin is not pale.     Findings: No rash.  Neurological:     General: No focal deficit present.     Mental Status: He is alert. Mental status is at baseline.  Psychiatric:        Mood and Affect: Mood normal.        Behavior: Behavior normal.     Lab Results  Component Value Date   WBC 3.1 (L) 06/04/2024   HGB 12.7 (L) 06/04/2024   HCT 39.3 06/04/2024   PLT 151.0 06/04/2024   GLUCOSE 107 (H) 06/04/2024   CHOL 192 06/04/2024   TRIG 73.0 06/04/2024   HDL 93.90 06/04/2024   LDLCALC 84 06/04/2024   ALT 46 06/04/2024   AST 29 06/04/2024   NA 139 06/04/2024   K 4.8 06/04/2024   CL 104 06/04/2024   CREATININE 1.16 06/04/2024   BUN 22 06/04/2024   CO2 28 06/04/2024   TSH 1.14 06/04/2024   PSA 0.54 06/04/2024   HGBA1C 5.7 04/26/2023   MICROALBUR 0.7 06/04/2024    CT Chest W Contrast Result Date: 12/29/2021 CLINICAL DATA:  Intercostal pain, anemia, weight  loss. EXAM: CT CHEST WITH CONTRAST TECHNIQUE: Multidetector CT imaging of the chest was performed during intravenous contrast administration. RADIATION DOSE REDUCTION: This exam was performed according to the departmental dose-optimization program which includes automated exposure control, adjustment of the mA and/or kV according to patient size and/or use of iterative reconstruction technique. CONTRAST:  70mL OMNIPAQUE  IOHEXOL  300 MG/ML  SOLN COMPARISON:  Chest x-ray 12/01/2021 FINDINGS: Cardiovascular: No significant vascular findings. Normal heart size. No pericardial effusion. Mediastinum/Nodes: No enlarged mediastinal, hilar, or  axillary lymph nodes. Thyroid  gland, trachea, and esophagus demonstrate no significant findings. Lungs/Pleura: Lungs are clear. No pleural effusion or pneumothorax. Upper Abdomen: No acute abnormality. Musculoskeletal: No chest wall abnormality. No acute or significant osseous findings. IMPRESSION: No active cardiopulmonary disease. Electronically Signed   By: Greig Pique M.D.   On: 12/29/2021 17:40    Assessment & Plan:   Hyperlipidemia LDL goal <130- Statin is not indicated. -     Lipid panel; Future -     TSH; Future -     Hepatic function panel; Future  Benign prostatic hyperplasia with weak urinary stream -     PSA; Future  Anemia due to acquired thiamine  deficiency -     CBC with Differential/Platelet; Future -     Vitamin B1; Future  Bradycardia- He is asx with this. -     TSH; Future -     EKG 12-Lead  Stage 3a chronic kidney disease (HCC)- Will start an SGLT2-inh. -     Basic metabolic panel with GFR; Future -     Microalbumin / creatinine urine ratio; Future -     Urinalysis, Routine w reflex microscopic; Future -     Empagliflozin ; Take 1 tablet (10 mg total) by mouth daily before breakfast.  Dispense: 90 tablet; Refill: 1  Encounter for general adult medical examination with abnormal findings- Exam completed, labs reviewed, vaccines reviewed,  cancer screenings addressed, pt ed material was given.   Anemia, deficiency- Vitamin levels are normal. -     IBC + Ferritin; Future -     Zinc ; Future -     Folate; Future -     Reticulocytes; Future -     Methylmalonic acid, serum; Future -     Vitamin B12; Future -     Vitamin B1; Future     Follow-up: Return in about 6 months (around 12/05/2024).  Debby Molt, MD

## 2024-06-04 NOTE — Patient Instructions (Signed)
 Health Maintenance, Male  Adopting a healthy lifestyle and getting preventive care are important in promoting health and wellness. Ask your health care provider about:  The right schedule for you to have regular tests and exams.  Things you can do on your own to prevent diseases and keep yourself healthy.  What should I know about diet, weight, and exercise?  Eat a healthy diet    Eat a diet that includes plenty of vegetables, fruits, low-fat dairy products, and lean protein.  Do not eat a lot of foods that are high in solid fats, added sugars, or sodium.  Maintain a healthy weight  Body mass index (BMI) is a measurement that can be used to identify possible weight problems. It estimates body fat based on height and weight. Your health care provider can help determine your BMI and help you achieve or maintain a healthy weight.  Get regular exercise  Get regular exercise. This is one of the most important things you can do for your health. Most adults should:  Exercise for at least 150 minutes each week. The exercise should increase your heart rate and make you sweat (moderate-intensity exercise).  Do strengthening exercises at least twice a week. This is in addition to the moderate-intensity exercise.  Spend less time sitting. Even light physical activity can be beneficial.  Watch cholesterol and blood lipids  Have your blood tested for lipids and cholesterol at 74 years of age, then have this test every 5 years.  You may need to have your cholesterol levels checked more often if:  Your lipid or cholesterol levels are high.  You are older than 74 years of age.  You are at high risk for heart disease.  What should I know about cancer screening?  Many types of cancers can be detected early and may often be prevented. Depending on your health history and family history, you may need to have cancer screening at various ages. This may include screening for:  Colorectal cancer.  Prostate cancer.  Skin cancer.  Lung  cancer.  What should I know about heart disease, diabetes, and high blood pressure?  Blood pressure and heart disease  High blood pressure causes heart disease and increases the risk of stroke. This is more likely to develop in people who have high blood pressure readings or are overweight.  Talk with your health care provider about your target blood pressure readings.  Have your blood pressure checked:  Every 3-5 years if you are 24-52 years of age.  Every year if you are 3 years old or older.  If you are between the ages of 60 and 72 and are a current or former smoker, ask your health care provider if you should have a one-time screening for abdominal aortic aneurysm (AAA).  Diabetes  Have regular diabetes screenings. This checks your fasting blood sugar level. Have the screening done:  Once every three years after age 66 if you are at a normal weight and have a low risk for diabetes.  More often and at a younger age if you are overweight or have a high risk for diabetes.  What should I know about preventing infection?  Hepatitis B  If you have a higher risk for hepatitis B, you should be screened for this virus. Talk with your health care provider to find out if you are at risk for hepatitis B infection.  Hepatitis C  Blood testing is recommended for:  Everyone born from [birth year] through 1966.  Anyone  with known risk factors for hepatitis C.  Sexually transmitted infections (STIs)  You should be screened each year for STIs, including gonorrhea and chlamydia, if:  You are sexually active and are younger than 74 years of age.  You are older than 74 years of age and your health care provider tells you that you are at risk for this type of infection.  Your sexual activity has changed since you were last screened, and you are at increased risk for chlamydia or gonorrhea. Ask your health care provider if you are at risk.  Ask your health care provider about whether you are at high risk for HIV. Your health care provider  may recommend a prescription medicine to help prevent HIV infection. If you choose to take medicine to prevent HIV, you should first get tested for HIV. You should then be tested every 3 months for as long as you are taking the medicine.  Follow these instructions at home:  Alcohol use  Do not drink alcohol if your health care provider tells you not to drink.  If you drink alcohol:  Limit how much you have to 0-2 drinks a day.  Know how much alcohol is in your drink. In the [place] one drink equals one 12 oz bottle of beer (355 mL), one 5 oz glass of wine (148 mL), or one 1 oz glass of hard liquor (44 mL).  Lifestyle  Do not use any products that contain nicotine or tobacco. These products include cigarettes, chewing tobacco, and vaping devices, such as e-cigarettes. If you need help quitting, ask your health care provider.  Do not use street drugs.  Do not share needles.  Ask your health care provider for help if you need support or information about quitting drugs.  General instructions  Schedule regular health, dental, and eye exams.  Stay current with your vaccines.  Tell your health care provider if:  You often feel depressed.  You have ever been abused or do not feel safe at home.  Summary  Adopting a healthy lifestyle and getting preventive care are important in promoting health and wellness.  Follow your health care provider's instructions about healthy diet, exercising, and getting tested or screened for diseases.  Follow your health care provider's instructions on monitoring your cholesterol and blood pressure.  This information is not intended to replace advice given to you by your health care provider. Make sure you discuss any questions you have with your health care provider.  Document Revised: 02/15/2021 Document Reviewed: 02/15/2021  Elsevier Patient Education  2024 [employer].

## 2024-06-04 NOTE — Telephone Encounter (Signed)
 Patient was seen in office

## 2024-06-07 ENCOUNTER — Encounter: Payer: Self-pay | Admitting: Internal Medicine

## 2024-06-12 LAB — ZINC: Zinc: 96 ug/dL (ref 60–130)

## 2024-06-12 LAB — RETICULOCYTES
ABS Retic: 53350 {cells}/uL (ref 25000–90000)
Retic Ct Pct: 1.1 %

## 2024-06-12 LAB — METHYLMALONIC ACID, SERUM

## 2024-06-12 LAB — VITAMIN B1: Vitamin B1 (Thiamine): 20 nmol/L (ref 8–30)

## 2024-07-29 ENCOUNTER — Other Ambulatory Visit (HOSPITAL_COMMUNITY): Payer: Self-pay

## 2024-07-29 ENCOUNTER — Other Ambulatory Visit: Payer: Self-pay

## 2024-07-29 MED ORDER — BRIMONIDINE TARTRATE-TIMOLOL 0.2-0.5 % OP SOLN
1.0000 [drp] | Freq: Two times a day (BID) | OPHTHALMIC | 3 refills | Status: AC
  Filled 2024-07-29: qty 15, 75d supply, fill #0

## 2024-07-30 ENCOUNTER — Other Ambulatory Visit: Payer: Self-pay

## 2024-07-31 ENCOUNTER — Other Ambulatory Visit: Payer: Self-pay

## 2024-08-15 DIAGNOSIS — M65311 Trigger thumb, right thumb: Secondary | ICD-10-CM | POA: Diagnosis not present

## 2024-09-12 DIAGNOSIS — M65311 Trigger thumb, right thumb: Secondary | ICD-10-CM | POA: Diagnosis not present

## 2025-02-26 ENCOUNTER — Ambulatory Visit
